# Patient Record
Sex: Female | Born: 1965 | Race: White | Hispanic: No | Marital: Married | Smoking: Former smoker
Health system: Southern US, Community
[De-identification: ages and names within clinical notes are randomized; demographics above are authoritative.]

## PROBLEM LIST (undated history)

## (undated) DIAGNOSIS — E538 Deficiency of other specified B group vitamins: Secondary | ICD-10-CM

## (undated) DIAGNOSIS — L900 Lichen sclerosus et atrophicus: Secondary | ICD-10-CM

## (undated) DIAGNOSIS — T7840XA Allergy, unspecified, initial encounter: Secondary | ICD-10-CM

## (undated) DIAGNOSIS — D563 Thalassemia minor: Secondary | ICD-10-CM

## (undated) DIAGNOSIS — K219 Gastro-esophageal reflux disease without esophagitis: Secondary | ICD-10-CM

## (undated) DIAGNOSIS — D649 Anemia, unspecified: Secondary | ICD-10-CM

## (undated) HISTORY — PX: WISDOM TOOTH EXTRACTION: SHX21

## (undated) HISTORY — DX: Lichen sclerosus et atrophicus: L90.0

## (undated) HISTORY — DX: Thalassemia minor: D56.3

## (undated) HISTORY — PX: BREAST SURGERY: SHX581

## (undated) HISTORY — DX: Anemia, unspecified: D64.9

## (undated) HISTORY — DX: Gastro-esophageal reflux disease without esophagitis: K21.9

## (undated) HISTORY — DX: Deficiency of other specified B group vitamins: E53.8

## (undated) HISTORY — DX: Allergy, unspecified, initial encounter: T78.40XA

---

## 2000-10-18 ENCOUNTER — Other Ambulatory Visit: Admission: RE | Admit: 2000-10-18 | Discharge: 2000-10-18 | Payer: Self-pay | Admitting: Obstetrics and Gynecology

## 2001-11-03 ENCOUNTER — Other Ambulatory Visit: Admission: RE | Admit: 2001-11-03 | Discharge: 2001-11-03 | Payer: Self-pay | Admitting: Obstetrics and Gynecology

## 2002-11-06 ENCOUNTER — Other Ambulatory Visit: Admission: RE | Admit: 2002-11-06 | Discharge: 2002-11-06 | Payer: Self-pay | Admitting: Obstetrics and Gynecology

## 2003-11-11 ENCOUNTER — Other Ambulatory Visit: Admission: RE | Admit: 2003-11-11 | Discharge: 2003-11-11 | Payer: Self-pay | Admitting: Obstetrics and Gynecology

## 2004-11-11 ENCOUNTER — Other Ambulatory Visit: Admission: RE | Admit: 2004-11-11 | Discharge: 2004-11-11 | Payer: Self-pay | Admitting: Obstetrics and Gynecology

## 2008-02-23 ENCOUNTER — Encounter: Admission: RE | Admit: 2008-02-23 | Discharge: 2008-02-23 | Payer: Self-pay | Admitting: Obstetrics and Gynecology

## 2009-02-24 ENCOUNTER — Encounter: Admission: RE | Admit: 2009-02-24 | Discharge: 2009-02-24 | Payer: Self-pay | Admitting: Obstetrics and Gynecology

## 2010-02-25 ENCOUNTER — Encounter: Admission: RE | Admit: 2010-02-25 | Discharge: 2010-02-25 | Payer: Self-pay | Admitting: Obstetrics and Gynecology

## 2011-01-21 ENCOUNTER — Other Ambulatory Visit: Payer: Self-pay | Admitting: Obstetrics and Gynecology

## 2011-01-21 DIAGNOSIS — Z1231 Encounter for screening mammogram for malignant neoplasm of breast: Secondary | ICD-10-CM

## 2011-03-01 ENCOUNTER — Ambulatory Visit
Admission: RE | Admit: 2011-03-01 | Discharge: 2011-03-01 | Disposition: A | Discharge status: Home / Self Care | Payer: BC Managed Care – PPO | Source: Ambulatory Visit | Attending: Obstetrics and Gynecology | Admitting: Obstetrics and Gynecology

## 2011-03-01 DIAGNOSIS — Z1231 Encounter for screening mammogram for malignant neoplasm of breast: Secondary | ICD-10-CM

## 2012-01-25 ENCOUNTER — Other Ambulatory Visit: Payer: Self-pay | Admitting: Obstetrics and Gynecology

## 2012-01-25 DIAGNOSIS — Z1231 Encounter for screening mammogram for malignant neoplasm of breast: Secondary | ICD-10-CM

## 2012-03-02 ENCOUNTER — Ambulatory Visit
Admission: RE | Admit: 2012-03-02 | Discharge: 2012-03-02 | Disposition: A | Discharge status: Home / Self Care | Payer: BC Managed Care – PPO | Source: Ambulatory Visit | Attending: Obstetrics and Gynecology | Admitting: Obstetrics and Gynecology

## 2012-03-02 DIAGNOSIS — Z1231 Encounter for screening mammogram for malignant neoplasm of breast: Secondary | ICD-10-CM

## 2013-01-09 ENCOUNTER — Ambulatory Visit (INDEPENDENT_AMBULATORY_CARE_PROVIDER_SITE_OTHER): Payer: BC Managed Care – PPO | Admitting: Adult Health

## 2013-01-09 ENCOUNTER — Encounter: Payer: Self-pay | Admitting: Adult Health

## 2013-01-09 VITALS — BP 132/80 | HR 80 | Temp 98.2°F | Resp 14 | Ht 64.0 in | Wt 156.0 lb

## 2013-01-09 DIAGNOSIS — Z23 Encounter for immunization: Secondary | ICD-10-CM

## 2013-01-09 DIAGNOSIS — Z1239 Encounter for other screening for malignant neoplasm of breast: Secondary | ICD-10-CM

## 2013-01-09 DIAGNOSIS — E538 Deficiency of other specified B group vitamins: Secondary | ICD-10-CM

## 2013-01-09 DIAGNOSIS — Z0000 Encounter for general adult medical examination without abnormal findings: Secondary | ICD-10-CM

## 2013-01-09 DIAGNOSIS — N951 Menopausal and female climacteric states: Secondary | ICD-10-CM

## 2013-01-09 LAB — VITAMIN B12: Vitamin B-12: 333 pg/mL (ref 211–911)

## 2013-01-09 LAB — COMPREHENSIVE METABOLIC PANEL
ALT: 13 U/L (ref 0–35)
AST: 19 U/L (ref 0–37)
Calcium: 9 mg/dL (ref 8.4–10.5)
Creatinine, Ser: 0.8 mg/dL (ref 0.4–1.2)
GFR: 82.28 mL/min (ref >60.00)
Glucose, Bld: 88 mg/dL (ref 70–99)
Potassium: 3.9 mEq/L (ref 3.5–5.1)
Sodium: 138 mEq/L (ref 135–145)
Total Protein: 7.5 g/dL (ref 6.0–8.3)

## 2013-01-09 LAB — CBC WITH DIFFERENTIAL/PLATELET
Basophils Absolute: 0 10*3/uL (ref 0.0–0.1)
HCT: 35.5 % — ABNORMAL LOW (ref 36.0–46.0)
Lymphocytes Relative: 24 % (ref 12.0–46.0)
MCHC: 31.4 g/dL (ref 30.0–36.0)
Monocytes Relative: 6.5 % (ref 3.0–12.0)
Platelets: 306 10*3/uL (ref 150.0–400.0)
WBC: 9.2 10*3/uL (ref 4.5–10.5)

## 2013-01-09 LAB — LDL CHOLESTEROL, DIRECT: Direct LDL: 123.5 mg/dL

## 2013-01-09 LAB — FOLLICLE STIMULATING HORMONE: FSH: 0.3 m[IU]/mL

## 2013-01-09 MED ORDER — CYANOCOBALAMIN 1000 MCG/ML IJ SOLN: 1000.0000 ug | Freq: Once | INTRAMUSCULAR | Status: DC

## 2013-01-09 MED ORDER — CYANOCOBALAMIN 1000 MCG/ML IJ SOLN
1000.0000 ug | Freq: Once | INTRAMUSCULAR | Status: AC
  Administered 2013-01-09: 1000 ug via INTRAMUSCULAR

## 2013-01-09 NOTE — Assessment & Plan Note (Signed)
Patient takes active BC pill continuously. She was started on this regimen secondary to migraine headaches that were considered hormonal. She is doing well on this regimen. However, patient is wondering how she would know when she no longer has a period since she is on this pill. Will check FSH level.

## 2013-01-09 NOTE — Progress Notes (Signed)
  Subjective:    Patient ID: Wendy Bautista, female    DOB: [birth year], 47 y.o.   MRN: [id number]  HPI  Wendy Bautista is a pleasant 47 y/o female who presents to the office today to establish care. She has a hx of GERD, Beta Thalassemia, B12 def., seasonal allergies. She was previously followed in [place], [place] by Cliffton Asters, NP. It was becoming increasingly difficult to drive out to [facility] for health care. She is followed by Dr Lavina Hamman for her GYN needs. Previous PAP was done 01/2012. Last Mammo was 03/02/12. We will schedule for mammo at [facility] off of [address]  Patient lives at home with her husband. They do not have any children. Wendy Bautista was adopted and does not have any information about her biological family regarding health hx. 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 She does not smoke. She has a glass of wine approximately weekly.   Current Outpatient Prescriptions on File Prior to Visit  Medication Sig Dispense Refill  . cetirizine (ZYRTEC) 10 MG tablet Take 10 mg by mouth daily.      Marland Kitchen desogestrel-ethinyl estradiol (APRI,EMOQUETTE,SOLIA) 0.15-30 MG-MCG tablet Take 1 tablet by mouth daily.      Marland Kitchen omeprazole (PRILOSEC) 20 MG capsule Take 20 mg by mouth daily.         Review of Systems  Constitutional: Negative.   HENT: Negative.        Occasional fluid in ear.  Eyes: Negative.   Respiratory: Negative.   Cardiovascular: Negative.   Gastrointestinal: Negative.   Genitourinary: Negative.   Musculoskeletal: Negative.   Neurological: Negative.   Hematological: Negative.   Psychiatric/Behavioral: Negative.     BP 132/80  Pulse 80  Temp 98.2 F (36.8 C) (Oral)  Resp 14  Ht 5\' 4"  (1.626 m)  Wt 156 lb (70.761 kg)  BMI 26.78 kg/m2  SpO2 100%     Objective:   Physical Exam  Constitutional: She is oriented to person, place, and time. She appears well-developed and well-nourished. No distress.  HENT:  Head: Normocephalic  and atraumatic.  Right Ear: External ear normal.  Left Ear: External ear normal.  Nose: Nose normal.  Mouth/Throat: Oropharynx is clear and moist.  Eyes: Conjunctivae normal and EOM are normal. Pupils are equal, round, and reactive to light.  Neck: Normal range of motion. Neck supple. No tracheal deviation present. No thyromegaly present.  Cardiovascular: Normal rate, regular rhythm and normal heart sounds.  Exam reveals no gallop and no friction rub.   No murmur heard. Pulmonary/Chest: Effort normal and breath sounds normal.  Abdominal: Soft. Bowel sounds are normal.  Musculoskeletal: Normal range of motion.  Lymphadenopathy:    She has no cervical adenopathy.  Neurological: She is alert and oriented to person, place, and time.  Skin: Skin is warm and dry.  Psychiatric: She has a normal mood and affect. Her behavior is normal. Judgment and thought content normal.       Assessment & Plan:

## 2013-01-09 NOTE — Assessment & Plan Note (Signed)
Normal physical exam. Will order yearly mammogram to be done at [facility] imaging per her request. Will check the following labs: cbc w/diff, cmet, lipids, B12, Vit D. Patient does not recall when her last tetanus shot was. Will order Tdap to be administered today.

## 2013-01-09 NOTE — Patient Instructions (Addendum)
  Thank you for choosing [facility] for your health care needs.  Please have your labs drawn prior to leaving the office.  Your lab results will be available through MyChart for your convenience. Please activate your MyChart at your earliest convenience. Instructions have been provided at the end of this form.  Return in 1 month for your B12 injection.  I will order a Mammography for you at [facility] on [address] to be done in March.  Please call if you have any questions or concerns.

## 2013-01-09 NOTE — Assessment & Plan Note (Signed)
Hx of B12 deficiency receiving month B12 injections. She has not had one since June. Will give her one today. Check levels.

## 2013-01-10 LAB — VITAMIN D 25 HYDROXY (VIT D DEFICIENCY, FRACTURES): Vit D, 25-Hydroxy: 55 ng/mL (ref 30–89)

## 2013-01-17 ENCOUNTER — Encounter: Payer: Self-pay | Admitting: Adult Health

## 2013-01-27 ENCOUNTER — Other Ambulatory Visit: Payer: Self-pay

## 2013-01-31 ENCOUNTER — Telehealth: Payer: Self-pay | Admitting: *Deleted

## 2013-01-31 NOTE — Telephone Encounter (Signed)
Patient called stating that she received a vial of B-12 from the pharmacy with no refills and not sure what to do with it.. Patient states that when she has gotten B-12 injections in the past she gets them at the doctor's office. Please let patient know what she should do?  Patient states that she has a follow-up appointment scheduled in March.

## 2013-02-01 NOTE — Telephone Encounter (Signed)
Informed patient to bring B12 she received from pharmacy to next visit

## 2013-02-01 NOTE — Telephone Encounter (Signed)
[name],  Let patient know to bring vitamin B12 vial with her for her next B12 injection and we will administer it for her.  Dason Mosley

## 2013-02-13 ENCOUNTER — Ambulatory Visit: Payer: BC Managed Care – PPO

## 2013-02-22 ENCOUNTER — Ambulatory Visit: Payer: BC Managed Care – PPO

## 2013-02-22 ENCOUNTER — Ambulatory Visit (INDEPENDENT_AMBULATORY_CARE_PROVIDER_SITE_OTHER): Payer: BC Managed Care – PPO | Admitting: *Deleted

## 2013-02-22 DIAGNOSIS — E538 Deficiency of other specified B group vitamins: Secondary | ICD-10-CM

## 2013-02-22 MED ORDER — CYANOCOBALAMIN 1000 MCG/ML IJ SOLN
1000.0000 ug | Freq: Once | INTRAMUSCULAR | Status: AC
  Administered 2013-02-22: 1000 ug via INTRAMUSCULAR

## 2013-03-07 ENCOUNTER — Other Ambulatory Visit: Payer: Self-pay | Admitting: *Deleted

## 2013-03-07 DIAGNOSIS — E538 Deficiency of other specified B group vitamins: Secondary | ICD-10-CM

## 2013-03-09 MED ORDER — CYANOCOBALAMIN 1000 MCG/ML IJ SOLN: 1000.0000 ug | Freq: Once | INTRAMUSCULAR | Status: DC

## 2013-04-11 ENCOUNTER — Ambulatory Visit (INDEPENDENT_AMBULATORY_CARE_PROVIDER_SITE_OTHER): Payer: BC Managed Care – PPO | Admitting: *Deleted

## 2013-04-11 DIAGNOSIS — E538 Deficiency of other specified B group vitamins: Secondary | ICD-10-CM

## 2013-04-11 MED ORDER — CYANOCOBALAMIN 1000 MCG/ML IJ SOLN
1000.0000 ug | Freq: Once | INTRAMUSCULAR | Status: AC
  Administered 2013-04-11: 1000 ug via INTRAMUSCULAR

## 2013-05-17 ENCOUNTER — Ambulatory Visit (INDEPENDENT_AMBULATORY_CARE_PROVIDER_SITE_OTHER): Payer: BC Managed Care – PPO | Admitting: *Deleted

## 2013-05-17 DIAGNOSIS — E538 Deficiency of other specified B group vitamins: Secondary | ICD-10-CM

## 2013-05-17 MED ORDER — CYANOCOBALAMIN 1000 MCG/ML IJ SOLN
1000.0000 ug | Freq: Once | INTRAMUSCULAR | Status: AC
  Administered 2013-05-17: 1000 ug via INTRAMUSCULAR

## 2013-07-17 ENCOUNTER — Encounter: Payer: Self-pay | Admitting: Adult Health

## 2013-07-17 ENCOUNTER — Ambulatory Visit (INDEPENDENT_AMBULATORY_CARE_PROVIDER_SITE_OTHER): Payer: BC Managed Care – PPO | Admitting: Adult Health

## 2013-07-17 VITALS — BP 106/66 | HR 69 | Temp 98.4°F | Resp 12 | Wt 152.0 lb

## 2013-07-17 DIAGNOSIS — J329 Chronic sinusitis, unspecified: Secondary | ICD-10-CM

## 2013-07-17 MED ORDER — AMOXICILLIN-POT CLAVULANATE 875-125 MG PO TABS: 1.0000 | ORAL_TABLET | Freq: Two times a day (BID) | ORAL | Status: DC

## 2013-07-17 MED ORDER — MOMETASONE FUROATE 50 MCG/ACT NA SUSP: 2.0000 | Freq: Every day | NASAL | Status: DC

## 2013-07-17 NOTE — Assessment & Plan Note (Signed)
Ongoing symptoms greater than 10 days. Also with left ear pressure. Fluid in ear. Start Augmentin bid x 10 days. Nasonex 2 sprays in each nostril. Irrigate sinuses at least 2 times per week. Call if not improved within 3-4 days.

## 2013-07-17 NOTE — Patient Instructions (Addendum)
  Start your antibiotic tonight. You will take 1 tablet twice a day for 10 days.  Use Nasonex 2 sprays into each nostril daily. If this helps let me know and I will send in prescription.  Irrigate sinuses at least twice a week.  Call if your symptoms are not improved within 3-4 days.

## 2013-07-17 NOTE — Progress Notes (Signed)
  Subjective:    Patient ID: Wendy Bautista, female    DOB: 1966-10-11, 46 y.o.   MRN: [id number]  HPI  Congestion, sinus pressure x 2 weeks. She has used Afrin today. She is experiencing left ear pain which started approximately 3 days ago. No fever or chills. She has also used a sinus and congestion medication which normally gives her a feeling of hang over the next day.   Current Outpatient Prescriptions on File Prior to Visit  Medication Sig Dispense Refill  . cetirizine (ZYRTEC) 10 MG tablet Take 10 mg by mouth daily.      . cyanocobalamin (,VITAMIN B-12,) 1000 MCG/ML injection Inject 1 mL (1,000 mcg total) into the muscle once.  1 mL  5  . desogestrel-ethinyl estradiol (APRI,EMOQUETTE,SOLIA) 0.15-30 MG-MCG tablet Take 1 tablet by mouth daily.      Marland Kitchen omeprazole (PRILOSEC) 20 MG capsule Take 20 mg by mouth daily.       No current facility-administered medications on file prior to visit.      Review of Systems  Constitutional: Negative for fever and chills.  HENT: Positive for ear pain, congestion, sore throat, postnasal drip and sinus pressure.        Ear pressure on left side  Respiratory: Positive for cough. Negative for shortness of breath and wheezing.   Cardiovascular: Negative.   Gastrointestinal: Positive for diarrhea. Negative for nausea and vomiting.       Objective:   Physical Exam  Constitutional: She is oriented to person, place, and time. She appears well-developed and well-nourished. No distress.  HENT:  Head: Normocephalic and atraumatic.  Right Ear: External ear normal.  Left TM bulging  Cardiovascular: Normal rate, regular rhythm, normal heart sounds and intact distal pulses.   Pulmonary/Chest: Effort normal and breath sounds normal. No respiratory distress. She has no wheezes. She has no rales.  Abdominal: Soft. Bowel sounds are normal.  Lymphadenopathy:    She has no cervical adenopathy.  Neurological: She is alert and oriented to person, place, and time.   Skin: Skin is warm and dry.  Psychiatric: She has a normal mood and affect. Her behavior is normal. Judgment and thought content normal.          Assessment & Plan:  cff

## 2013-08-02 ENCOUNTER — Encounter: Payer: Self-pay | Admitting: Adult Health

## 2013-08-02 NOTE — Telephone Encounter (Signed)
Sample left up front for pickup.

## 2013-08-18 ENCOUNTER — Encounter: Payer: Self-pay | Admitting: Adult Health

## 2013-08-20 ENCOUNTER — Telehealth: Payer: Self-pay | Admitting: *Deleted

## 2013-08-20 ENCOUNTER — Other Ambulatory Visit: Payer: Self-pay | Admitting: Adult Health

## 2013-08-20 MED ORDER — MOMETASONE FUROATE 50 MCG/ACT NA SUSP: 2.0000 | Freq: Every day | NASAL | Status: DC

## 2013-08-20 NOTE — Telephone Encounter (Signed)
Called [phone] for prior authorization on the Nasonex 50 mcg nasal spray, received PA request form, placed in [facility] boxes

## 2013-08-21 ENCOUNTER — Encounter: Payer: Self-pay | Admitting: Adult Health

## 2013-08-21 ENCOUNTER — Encounter: Payer: Self-pay | Admitting: *Deleted

## 2013-08-23 ENCOUNTER — Telehealth: Payer: Self-pay | Admitting: *Deleted

## 2013-08-23 NOTE — Telephone Encounter (Signed)
Faxed Prior Authorization for Nasonex to [employer].

## 2013-09-05 ENCOUNTER — Encounter: Payer: Self-pay | Admitting: Adult Health

## 2013-09-06 ENCOUNTER — Other Ambulatory Visit: Payer: Self-pay | Admitting: Adult Health

## 2013-09-06 MED ORDER — FLUTICASONE PROPIONATE 50 MCG/ACT NA SUSP: NASAL | Status: DC

## 2013-10-12 ENCOUNTER — Ambulatory Visit (INDEPENDENT_AMBULATORY_CARE_PROVIDER_SITE_OTHER): Payer: BC Managed Care – PPO

## 2013-10-12 DIAGNOSIS — Z23 Encounter for immunization: Secondary | ICD-10-CM

## 2014-02-11 ENCOUNTER — Encounter: Payer: Self-pay | Admitting: Adult Health

## 2014-02-14 ENCOUNTER — Other Ambulatory Visit: Payer: Self-pay | Admitting: Adult Health

## 2014-02-14 ENCOUNTER — Encounter: Payer: Self-pay | Admitting: Adult Health

## 2014-02-14 ENCOUNTER — Ambulatory Visit (INDEPENDENT_AMBULATORY_CARE_PROVIDER_SITE_OTHER)
Admission: RE | Admit: 2014-02-14 | Discharge: 2014-02-14 | Disposition: A | Discharge status: Home / Self Care | Payer: BC Managed Care – PPO | Source: Ambulatory Visit | Attending: Adult Health | Admitting: Adult Health

## 2014-02-14 ENCOUNTER — Ambulatory Visit (INDEPENDENT_AMBULATORY_CARE_PROVIDER_SITE_OTHER): Payer: BC Managed Care – PPO | Admitting: Adult Health

## 2014-02-14 VITALS — BP 110/74 | HR 80 | Temp 98.3°F | Resp 14 | Wt 162.0 lb

## 2014-02-14 DIAGNOSIS — R1032 Left lower quadrant pain: Secondary | ICD-10-CM | POA: Insufficient documentation

## 2014-02-14 DIAGNOSIS — R3129 Other microscopic hematuria: Secondary | ICD-10-CM

## 2014-02-14 DIAGNOSIS — R319 Hematuria, unspecified: Secondary | ICD-10-CM

## 2014-02-14 LAB — URINALYSIS, ROUTINE W REFLEX MICROSCOPIC
BILIRUBIN URINE: NEGATIVE
Ketones, ur: NEGATIVE
Leukocytes, UA: NEGATIVE
NITRITE: NEGATIVE
PH: 6 (ref 5.0–8.0)
SPECIFIC GRAVITY, URINE: 1.01 (ref 1.000–1.030)
TOTAL PROTEIN, URINE-UPE24: NEGATIVE
Urine Glucose: NEGATIVE
Urobilinogen, UA: 0.2 (ref 0.0–1.0)

## 2014-02-14 LAB — POCT URINALYSIS DIPSTICK
Bilirubin, UA: NEGATIVE
Glucose, UA: NEGATIVE
Ketones, UA: NEGATIVE
Leukocytes, UA: NEGATIVE
Nitrite, UA: NEGATIVE
PROTEIN UA: NEGATIVE
SPEC GRAV UA: 1.01
Urobilinogen, UA: 0.2
pH, UA: 5.5

## 2014-02-14 LAB — CBC WITH DIFFERENTIAL/PLATELET
BASOS PCT: 0.7 % (ref 0.0–3.0)
Basophils Absolute: 0.1 10*3/uL (ref 0.0–0.1)
EOS ABS: 0.1 10*3/uL (ref 0.0–0.7)
EOS PCT: 0.9 % (ref 0.0–5.0)
HEMATOCRIT: 35.7 % — AB (ref 36.0–46.0)
Hemoglobin: 11.2 g/dL — ABNORMAL LOW (ref 12.0–15.0)
LYMPHS PCT: 32.7 % (ref 12.0–46.0)
Lymphs Abs: 2.8 10*3/uL (ref 0.7–4.0)
MCHC: 31.5 g/dL (ref 30.0–36.0)
MONO ABS: 0.7 10*3/uL (ref 0.1–1.0)
MONOS PCT: 8.1 % (ref 3.0–12.0)
Neutro Abs: 4.9 10*3/uL (ref 1.4–7.7)
Neutrophils Relative %: 57.6 % (ref 43.0–77.0)
PLATELETS: 330 10*3/uL (ref 150.0–400.0)
RBC: 5.74 Mil/uL — ABNORMAL HIGH (ref 3.87–5.11)
RDW: 16.9 % — ABNORMAL HIGH (ref 11.5–14.6)
WBC: 8.6 10*3/uL (ref 4.5–10.5)

## 2014-02-14 LAB — BASIC METABOLIC PANEL
BUN: 9 mg/dL (ref 6–23)
CALCIUM: 9 mg/dL (ref 8.4–10.5)
CO2: 25 mEq/L (ref 19–32)
CREATININE: 0.7 mg/dL (ref 0.4–1.2)
Chloride: 104 mEq/L (ref 96–112)
GFR: 91.01 mL/min (ref >60.00)
Glucose, Bld: 75 mg/dL (ref 70–99)
Potassium: 4.1 mEq/L (ref 3.5–5.1)
SODIUM: 135 meq/L (ref 135–145)

## 2014-02-14 NOTE — Progress Notes (Signed)
Patient ID: Wendy Bautista, female   DOB: [birth year], 48 y.o.   MRN: [id number]    Subjective:    Patient ID: Wendy SongJenny Bautista, female    DOB: [birth year], 48 y.o.   MRN: [id number]  HPI  Wendy Bautista is a pleasant 48 y/o female who presents to clinic with left lower quadrant pain. She denies fever, chills. She has had periods of loose stools. Denies blood in her stool. She reports recent visit to GYN and was notified that she had blood in her urine. She reports that they did not give her further instructions or follow up.   Past Medical History  Diagnosis Date  . GERD (gastroesophageal reflux disease)   . Thalassemia trait, beta   . Lichen sclerosus   . B12 deficiency     Current Outpatient Prescriptions on File Prior to Visit  Medication Sig Dispense Refill  . cetirizine (ZYRTEC) 10 MG tablet Take 10 mg by mouth daily.      Marland Kitchen. desogestrel-ethinyl estradiol (APRI,EMOQUETTE,SOLIA) 0.15-30 MG-MCG tablet Take 1 tablet by mouth daily.      . fluticasone (FLONASE) 50 MCG/ACT nasal spray 2 sprays into each nostril once daily  16 g  6  . omeprazole (PRILOSEC) 20 MG capsule Take 20 mg by mouth daily as needed.        No current facility-administered medications on file prior to visit.   Review of Systems  Constitutional: Negative.  Negative for fever and chills.  HENT: Negative.   Eyes: Negative.   Respiratory: Negative.   Cardiovascular: Negative.   Gastrointestinal: Positive for abdominal pain (left side). Negative for blood in stool. Diarrhea: loose stools.  Endocrine: Negative.   Genitourinary: Negative.   Musculoskeletal: Negative.   Skin: Negative.   Allergic/Immunologic: Negative.   Neurological: Negative.   Hematological: Negative.   Psychiatric/Behavioral: Negative.        Objective:  BP 110/74  Pulse 80  Temp(Src) 98.3 F (36.8 C) (Oral)  Resp 14  Wt 162 lb (73.483 kg)  SpO2 97%   Physical Exam  Constitutional: She is oriented to person, place, and time. She appears  well-developed and well-nourished. No distress.  Cardiovascular: Normal rate and regular rhythm.   Pulmonary/Chest: Effort normal. No respiratory distress.  Abdominal: Soft. Bowel sounds are normal. She exhibits no distension and no mass. There is tenderness. There is no rebound and no guarding.  Neurological: She is alert and oriented to person, place, and time.  Skin: Skin is warm and dry.  Psychiatric: She has a normal mood and affect. Her behavior is normal. Judgment and thought content normal.       Assessment & Plan:   1. Abdominal pain, left lower quadrant Symptoms persist but have improved. ?Diverticulitis, nephrolithiasis. Check labs. Will have KUB. Pt may need CT scan if ongoing symptoms and no findings on KUB.  - CBC with Differential - Basic metabolic panel - DG Abd 1 View; Future  2. Blood in urine Check UA, urinalysis. Have KUB to r/o kidney stone; however, may need CT scan  - DG Abd 1 View; Future - Urinalysis, Routine w reflex microscopic - POCT urinalysis dipstick

## 2014-02-14 NOTE — Progress Notes (Signed)
Pre visit review using our clinic review tool, if applicable. No additional management support is needed unless otherwise documented below in the visit note. 

## 2014-04-12 ENCOUNTER — Ambulatory Visit: Payer: BC Managed Care – PPO | Admitting: Adult Health

## 2014-04-18 ENCOUNTER — Encounter: Payer: Self-pay | Admitting: Adult Health

## 2014-04-18 ENCOUNTER — Ambulatory Visit (INDEPENDENT_AMBULATORY_CARE_PROVIDER_SITE_OTHER): Payer: BC Managed Care – PPO | Admitting: Adult Health

## 2014-04-18 VITALS — BP 104/62 | HR 80 | Temp 98.2°F | Resp 12 | Wt 158.8 lb

## 2014-04-18 DIAGNOSIS — J3489 Other specified disorders of nose and nasal sinuses: Secondary | ICD-10-CM

## 2014-04-18 NOTE — Progress Notes (Signed)
Pre visit review using our clinic review tool, if applicable. No additional management support is needed unless otherwise documented below in the visit note. 

## 2014-04-18 NOTE — Progress Notes (Signed)
   Subjective:    Patient ID: Wendy Bautista, female    DOB: 1966-01-06, 48 y.o.   MRN: [id number]  HPI Wendy Bautista is a pleasant 48 y/o female who presents to clinic with ongoing symptoms of sinus pressure, post nasal drip, cough, and occasional ear pressure mainly on the left. She takes zyrtec or allegra but has not been consistent with this. She uses flonase. No fever, chills. She feels that her symptoms are always present. She would like to be tested for allergies to see if there is something in particular that may be causing her symptoms.  Past Medical History  Diagnosis Date  . GERD (gastroesophageal reflux disease)   . Thalassemia trait, beta   . Lichen sclerosus   . B12 deficiency     Current Outpatient Prescriptions on File Prior to Visit  Medication Sig Dispense Refill  . cetirizine (ZYRTEC) 10 MG tablet Take 10 mg by mouth daily.      Marland Kitchen. desogestrel-ethinyl estradiol (APRI,EMOQUETTE,SOLIA) 0.15-30 MG-MCG tablet Take 1 tablet by mouth daily.      . fluticasone (FLONASE) 50 MCG/ACT nasal spray 2 sprays into each nostril once daily  16 g  6  . omeprazole (PRILOSEC) 20 MG capsule Take 20 mg by mouth daily as needed.        No current facility-administered medications on file prior to visit.   Review of Systems  Constitutional: Negative for fever and chills.  HENT: Positive for postnasal drip and sinus pressure. Negative for sore throat.   Respiratory: Positive for cough.   All other systems reviewed and are negative.      Objective:   Physical Exam  Constitutional: She is oriented to person, place, and time. She appears well-developed and well-nourished. No distress.  HENT:  Head: Normocephalic and atraumatic.  Right Ear: External ear normal.  Left Ear: External ear normal.  Mouth/Throat: Oropharynx is clear and moist. No oropharyngeal exudate.  Eyes: Conjunctivae and EOM are normal.  Cardiovascular: Normal rate, regular rhythm and normal heart sounds.  Exam reveals no gallop.     No murmur heard. Pulmonary/Chest: Effort normal and breath sounds normal. No respiratory distress. She has no wheezes. She has no rales.  Lymphadenopathy:    She has no cervical adenopathy.  Neurological: She is alert and oriented to person, place, and time.  Psychiatric: She has a normal mood and affect. Her behavior is normal. Judgment and thought content normal.      Assessment & Plan:   1. Sinus pressure Advised to take the allegra or zyrtec regularly. Continue flonase daily. Irrigate sinuses daily. Try sudafed to see if it helps her symptoms. Delsym for cough. I am referring her to ENT for evaluation of her sinus pressure and ear symptoms as well as for possible allergy testing. - Ambulatory referral to ENT

## 2014-04-18 NOTE — Patient Instructions (Signed)
  I am referring you to ENT for evaluation of your ongoing sinus, ear symptoms and for allergy testing.  The office will contact you with an appointment.  Continue flonase nasal spray.  Irrigate your sinuses with saline at least once daily.  Take allegra or zyrtec daily for control on allergies.  You can try sudafed to see if this improves some of your symptoms.  Delsym with guaifenesin and dextromethorphan for cough.  Cool mist humidifier may also help alleviate your symptoms.

## 2014-09-05 ENCOUNTER — Encounter: Payer: Self-pay | Admitting: Internal Medicine

## 2014-09-05 ENCOUNTER — Ambulatory Visit (INDEPENDENT_AMBULATORY_CARE_PROVIDER_SITE_OTHER): Payer: BC Managed Care – PPO | Admitting: Internal Medicine

## 2014-09-05 VITALS — BP 110/66 | HR 82 | Temp 98.5°F | Wt 157.0 lb

## 2014-09-05 DIAGNOSIS — R1032 Left lower quadrant pain: Secondary | ICD-10-CM

## 2014-09-05 DIAGNOSIS — R197 Diarrhea, unspecified: Secondary | ICD-10-CM

## 2014-09-05 DIAGNOSIS — R109 Unspecified abdominal pain: Secondary | ICD-10-CM

## 2014-09-05 MED ORDER — PROMETHAZINE HCL 12.5 MG PO TABS: 12.5000 mg | ORAL_TABLET | Freq: Four times a day (QID) | ORAL | Status: DC | PRN

## 2014-09-05 NOTE — Progress Notes (Signed)
Pre visit review using our clinic review tool, if applicable. No additional management support is needed unless otherwise documented below in the visit note. 

## 2014-09-05 NOTE — Progress Notes (Signed)
Subjective:    Patient ID: Wendy Bautista, female    DOB: 29-Jun-1966, 48 y.o.   MRN: [id number]  HPI  Patient presents with diarrhea, nausea and cramping for the past 5 days--watery--loose--has noticed thick mucous--denied seeing blood in stool--color--pale--yellow....5-8 episodes each, nocturnal 2-4, have taken imodium with some relief yesterday. No recent abx - no sick contacts , no fever,   Review of Systems  Past Medical History  Diagnosis Date  . GERD (gastroesophageal reflux disease)   . Thalassemia trait, beta   . Lichen sclerosus   . B12 deficiency     Current Outpatient Prescriptions  Medication Sig Dispense Refill  . cetirizine (ZYRTEC) 10 MG tablet Take 10 mg by mouth daily.      Marland Kitchen desogestrel-ethinyl estradiol (APRI,EMOQUETTE,SOLIA) 0.15-30 MG-MCG tablet Take 1 tablet by mouth daily.      . fluticasone (FLONASE) 50 MCG/ACT nasal spray 2 sprays into each nostril once daily  16 g  6  . omeprazole (PRILOSEC) 20 MG capsule Take 20 mg by mouth daily as needed.        No current facility-administered medications for this visit.    Allergies  Allergen Reactions  . Clostebol     No family history on file.  History   Social History  . Marital Status: Married    Spouse Name: Albertia Carvin    Number of Children: 0  . Years of Education: 13   Occupational History  . Underwriter    Social History Main Topics  . Smoking status: Never Smoker   . Smokeless tobacco: Never Used  . Alcohol Use: 0.6 oz/week    1 Glasses of wine per week  . Drug Use: No  . Sexual Activity: Yes   Other Topics Concern  . Not on file   Social History Narrative  . No narrative on file     Constitutional: Denies fever, malaise, fatigue, headache or abrupt weight changes.  Respiratory: Denies difficulty breathing, shortness of breath, cough or sputum production.   Cardiovascular: Denies chest pain, chest tightness, palpitations or swelling in the hands or feet.  Gastrointestinal:  Denies abdominal pain, bloating, constipation, diarrhea or blood in the stool.    No other specific complaints in a complete review of systems (except as listed in HPI above).     Objective:   Physical Exam  Wt 157 lb (71.215 kg) Wt Readings from Last 3 Encounters:  09/05/14 157 lb (71.215 kg)  04/18/14 158 lb 12 oz (72.009 kg)  02/14/14 162 lb (73.483 kg)    General: Appears their stated age, well developed, well nourished in NAD. Cardiovascular: Normal rate and rhythm. S1,S2 noted.  No murmur, rubs or gallops noted. No JVD or BLE edema. No carotid bruits noted. Pulmonary/Chest: Normal effort and positive vesicular breath sounds. No respiratory distress. No wheezes, rales or ronchi noted.  Abdomen: Soft and nontender. Normal bowel sounds, no bruits noted. No distention or masses noted. Liver, spleen and kidneys non palpable.     Component Value Date/Time   NA 135 02/14/2014 1237   K 4.1 02/14/2014 1237   CL 104 02/14/2014 1237   CO2 25 02/14/2014 1237   GLUCOSE 75 02/14/2014 1237   BUN 9 02/14/2014 1237   CREATININE 0.7 02/14/2014 1237   CALCIUM 9.0 02/14/2014 1237    Lipid Panel     Component Value Date/Time   CHOL 203* 01/09/2013 1051   TRIG 182.0* 01/09/2013 1051   HDL 53.90 01/09/2013 1051   CHOLHDL 4 01/09/2013 1051  VLDL 36.4 01/09/2013 1051    CBC    Component Value Date/Time   WBC 8.6 02/14/2014 1237   RBC 5.74* 02/14/2014 1237   HGB 11.2* 02/14/2014 1237   HCT 35.7* 02/14/2014 1237   PLT 330.0 02/14/2014 1237   MCV 62.2 Repeated and verified X2.* 02/14/2014 1237   MCHC 31.5 02/14/2014 1237   RDW 16.9* 02/14/2014 1237   LYMPHSABS 2.8 02/14/2014 1237   MONOABS 0.7 02/14/2014 1237   EOSABS 0.1 02/14/2014 1237   BASOSABS 0.1 02/14/2014 1237    Hgb A1C No results found for this basename: HGBA1C         Assessment & Plan:

## 2014-09-05 NOTE — Progress Notes (Signed)
Subjective:    Patient ID: Wendy Bautista, female    DOB: May 08, 1966, 48 y.o.   MRN: [id number]  HPI  Pt presents to the clinic today with c/o abdominal cramping and diarrhea. She reports the cramping occurs shortly after eating. She does have associated nausea. She has been having loose stools for the last 5 days. The stool is pale yellow mixed with mucous. She is having 5-8 episodes during the day, nocturnal as well. She denies blood in her stool. She has tried Imodium with some relief. She denies changes in her diet. She is on prilosec for GERD. She denies recent antibiotic use.  Review of Systems      Past Medical History  Diagnosis Date  . GERD (gastroesophageal reflux disease)   . Thalassemia trait, beta   . Lichen sclerosus   . B12 deficiency     Current Outpatient Prescriptions  Medication Sig Dispense Refill  . cetirizine (ZYRTEC) 10 MG tablet Take 10 mg by mouth daily.      Marland Kitchen desogestrel-ethinyl estradiol (APRI,EMOQUETTE,SOLIA) 0.15-30 MG-MCG tablet Take 1 tablet by mouth daily.      . fluticasone (FLONASE) 50 MCG/ACT nasal spray 2 sprays into each nostril once daily  16 g  6  . omeprazole (PRILOSEC) 20 MG capsule Take 20 mg by mouth daily as needed.        No current facility-administered medications for this visit.    Allergies  Allergen Reactions  . Clostebol     No family history on file.  History   Social History  . Marital Status: Married    Spouse Name: Christi Wirick    Number of Children: 0  . Years of Education: 13   Occupational History  . Underwriter    Social History Main Topics  . Smoking status: Never Smoker   . Smokeless tobacco: Never Used  . Alcohol Use: 0.6 oz/week    1 Glasses of wine per week  . Drug Use: No  . Sexual Activity: Yes   Other Topics Concern  . Not on file   Social History Narrative  . No narrative on file     Constitutional: Denies fever, malaise, fatigue, headache or abrupt weight changes.  Gastrointestinal:  Pt reports abdominal cramping and diarrhea. Denies bloating, constipation, or blood in the stool.    No other specific complaints in a complete review of systems (except as listed in HPI above).  Objective:   Physical Exam   BP 110/66  Pulse 82  Temp(Src) 98.5 F (36.9 C) (Oral)  Wt 157 lb (71.215 kg)  SpO2 99% Wt Readings from Last 3 Encounters:  09/05/14 157 lb (71.215 kg)  04/18/14 158 lb 12 oz (72.009 kg)  02/14/14 162 lb (73.483 kg)    General: Appears her stated age, well developed, well nourished in NAD. Cardiovascular: Normal rate and rhythm. S1,S2 noted.  No murmur, rubs or gallops noted.  Pulmonary/Chest: Normal effort and positive vesicular breath sounds. No respiratory distress. No wheezes, rales or ronchi noted.  Abdomen: Soft and nontender. Normal bowel sounds, no bruits noted. No distention or masses noted. Liver, spleen and kidneys non palpable.   BMET    Component Value Date/Time   NA 135 02/14/2014 1237   K 4.1 02/14/2014 1237   CL 104 02/14/2014 1237   CO2 25 02/14/2014 1237   GLUCOSE 75 02/14/2014 1237   BUN 9 02/14/2014 1237   CREATININE 0.7 02/14/2014 1237   CALCIUM 9.0 02/14/2014 1237    Lipid Panel  Component Value Date/Time   CHOL 203* 01/09/2013 1051   TRIG 182.0* 01/09/2013 1051   HDL 53.90 01/09/2013 1051   CHOLHDL 4 01/09/2013 1051   VLDL 36.4 01/09/2013 1051    CBC    Component Value Date/Time   WBC 8.6 02/14/2014 1237   RBC 5.74* 02/14/2014 1237   HGB 11.2* 02/14/2014 1237   HCT 35.7* 02/14/2014 1237   PLT 330.0 02/14/2014 1237   MCV 62.2 Repeated and verified X2.* 02/14/2014 1237   MCHC 31.5 02/14/2014 1237   RDW 16.9* 02/14/2014 1237   LYMPHSABS 2.8 02/14/2014 1237   MONOABS 0.7 02/14/2014 1237   EOSABS 0.1 02/14/2014 1237   BASOSABS 0.1 02/14/2014 1237    Hgb A1C No results found for this basename: HGBA1C        Assessment & Plan:   Abdominal cramping and diarrhea:  ? Cause Will check stool culture, ova and parasite and Cdiff Drink plenty of  fluids Take Imodium OTC  Will get back with you after the labs are back

## 2014-09-05 NOTE — Patient Instructions (Signed)

## 2014-09-06 ENCOUNTER — Other Ambulatory Visit: Payer: Self-pay | Admitting: Internal Medicine

## 2014-09-06 NOTE — Addendum Note (Signed)
Addended by: Roena Malady on: 09/06/2014 08:51 AM   Modules accepted: Orders

## 2014-09-06 NOTE — Addendum Note (Signed)
Addended by: Baldomero Lamy on: 09/06/2014 10:51 AM   Modules accepted: Orders

## 2014-09-07 LAB — C. DIFFICILE GDH AND TOXIN A/B
C. DIFFICILE GDH: NOT DETECTED
C. difficile Toxin A/B: NOT DETECTED

## 2014-09-09 LAB — GIARDIA/CRYPTOSPORIDIUM (EIA)
CRYPTOSPORIDIUM SCREEN (EIA) (SOL): NEGATIVE
Giardia Screen (EIA): NEGATIVE

## 2014-09-10 LAB — STOOL CULTURE

## 2014-09-12 ENCOUNTER — Encounter: Payer: Self-pay | Admitting: Internal Medicine

## 2014-09-13 ENCOUNTER — Encounter: Payer: Self-pay | Admitting: Internal Medicine

## 2014-09-13 ENCOUNTER — Ambulatory Visit (INDEPENDENT_AMBULATORY_CARE_PROVIDER_SITE_OTHER): Payer: BC Managed Care – PPO | Admitting: Internal Medicine

## 2014-09-13 VITALS — BP 120/88 | HR 89 | Temp 98.1°F | Ht 64.0 in | Wt 158.5 lb

## 2014-09-13 DIAGNOSIS — R197 Diarrhea, unspecified: Secondary | ICD-10-CM

## 2014-09-13 DIAGNOSIS — D563 Thalassemia minor: Secondary | ICD-10-CM | POA: Insufficient documentation

## 2014-09-13 DIAGNOSIS — R110 Nausea: Secondary | ICD-10-CM

## 2014-09-13 LAB — LIPASE: Lipase: 16 U/L (ref 0–75)

## 2014-09-13 MED ORDER — ONDANSETRON 8 MG PO TBDP: 8.0000 mg | ORAL_TABLET | Freq: Three times a day (TID) | ORAL | Status: DC | PRN

## 2014-09-13 NOTE — Assessment & Plan Note (Addendum)
Recent watery diarrhea. Labs including stool studies were negative. Will get CT abdomen given recent left epigastric pain. Will also check celiac panel and IBD panel. Given some epigastric pain and nausea will check lipase. Will set up GI evaluation for possible colonoscopy. Question microscopic colitis.

## 2014-09-13 NOTE — Progress Notes (Signed)
Pre visit review using our clinic review tool, if applicable. No additional management support is needed unless otherwise documented below in the visit note. 

## 2014-09-13 NOTE — Assessment & Plan Note (Signed)
Will change to Ondansetron. Will check lipase with labs. CT abdomen for further evaluation given epigastric and lower abdominal pain.

## 2014-09-13 NOTE — Assessment & Plan Note (Signed)
Reviewed labs with pt. Mild anemia noted and previous evaluation c/w beta thalassemia minor.

## 2014-09-13 NOTE — Progress Notes (Signed)
Subjective:    Patient ID: Wendy Bautista, female    DOB: Apr 23, 1966, 48 y.o.   MRN: [id number]  HPI 48YO female presents to follow up diarrhea. Evaluated by another provider 9/24 and had labs and stool studies which were normal. Continues to have watery diarrhea. 4 episodes this morning. Wakes out of sleep. Some discomfort in left upper abdomen.  No blood in stool. No vomiting, but feels nauseous. Feels tired. No fever. Certain foods, particularly dairy makes symptoms worse. Cereal and oatmeal. Not taking anything for symptoms. Was prescribed phenergan, but cannot tolerate because of drowsiness. No history of colonoscopy. Adopted, but knows that mother died from colon cancer.  Review of Systems  Constitutional: Positive for fatigue. Negative for fever, chills, appetite change and unexpected weight change.  Eyes: Negative for visual disturbance.  Respiratory: Negative for shortness of breath.   Cardiovascular: Negative for chest pain and leg swelling.  Gastrointestinal: Positive for nausea and diarrhea. Negative for vomiting, abdominal pain, constipation, blood in stool, anal bleeding and rectal pain.  Skin: Negative for color change and rash.  Hematological: Negative for adenopathy. Does not bruise/bleed easily.  Psychiatric/Behavioral: Negative for dysphoric mood. The patient is not nervous/anxious.        Objective:    BP 120/88  Pulse 89  Temp(Src) 98.1 F (36.7 C) (Oral)  Ht 5\' 4"  (1.626 m)  Wt 158 lb 8 oz (71.895 kg)  BMI 27.19 kg/m2  SpO2 98% Physical Exam  Constitutional: She is oriented to person, place, and time. She appears well-developed and well-nourished. No distress.  HENT:  Head: Normocephalic and atraumatic.  Right Ear: External ear normal.  Left Ear: External ear normal.  Nose: Nose normal.  Mouth/Throat: Oropharynx is clear and moist. No oropharyngeal exudate.  Eyes: Conjunctivae and EOM are normal. Pupils are equal, round, and reactive to light. Right eye  exhibits no discharge. Left eye exhibits no discharge. No scleral icterus.  Neck: Normal range of motion. Neck supple. No tracheal deviation present. No thyromegaly present.  Cardiovascular: Normal rate, regular rhythm, normal heart sounds and intact distal pulses.  Exam reveals no gallop and no friction rub.   No murmur heard. Pulmonary/Chest: Effort normal and breath sounds normal. No accessory muscle usage. Not tachypneic. No respiratory distress. She has no decreased breath sounds. She has no wheezes. She has no rhonchi. She has no rales. She exhibits no tenderness.  Abdominal: Soft. Bowel sounds are normal. She exhibits no distension and no mass. There is tenderness (left epigastric area). There is no rebound and no guarding.  Musculoskeletal: Normal range of motion. She exhibits no edema and no tenderness.  Lymphadenopathy:    She has no cervical adenopathy.  Neurological: She is alert and oriented to person, place, and time. No cranial nerve deficit. She exhibits normal muscle tone. Coordination normal.  Skin: Skin is warm and dry. No rash noted. She is not diaphoretic. No erythema. No pallor.  Psychiatric: She has a normal mood and affect. Her behavior is normal. Judgment and thought content normal.          Assessment & Plan:   Problem List Items Addressed This Visit     Unprioritized   Beta thalassemia minor     Reviewed labs with pt. Mild anemia noted and previous evaluation c/w beta thalassemia minor.    Relevant Orders      Lipase   Diarrhea - Primary     Recent watery diarrhea. Labs including stool studies were negative. Will get CT abdomen  given recent left epigastric pain. Will also check celiac panel and IBD panel. Given some epigastric pain and nausea will check lipase. Will set up GI evaluation for possible colonoscopy. Question microscopic colitis.    Relevant Orders      CT Abdomen Pelvis W Contrast      Gliadin antibodies, serum      Tissue transglutaminase,  IgA      Reticulin Antibody, IgA w reflex titer      Inflammatory bowel disease panel      Ambulatory referral to Gastroenterology      Lipase   Nausea without vomiting     Will change to Ondansetron. Will check lipase with labs. CT abdomen for further evaluation given epigastric and lower abdominal pain.    Relevant Medications      ondansetron (ZOFRAN-ODT) disintegrating tablet   Other Relevant Orders      Lipase       Return in about 4 weeks (around 10/11/2014) for Recheck.

## 2014-09-13 NOTE — Patient Instructions (Addendum)
Start FODMAP diet.  Labs today.  CT abdomen and pelvis.  We will set up an evaluation with GI.  Follow up in 4 weeks.

## 2014-09-17 ENCOUNTER — Ambulatory Visit: Payer: Self-pay | Admitting: Internal Medicine

## 2014-09-18 ENCOUNTER — Telehealth: Payer: Self-pay | Admitting: Internal Medicine

## 2014-09-18 DIAGNOSIS — I700 Atherosclerosis of aorta: Secondary | ICD-10-CM | POA: Insufficient documentation

## 2014-09-18 NOTE — Telephone Encounter (Signed)
CT abdomen showed no findings to explain diarrhea. Message sent to patient.

## 2014-09-19 ENCOUNTER — Encounter: Payer: Self-pay | Admitting: Nurse Practitioner

## 2014-09-19 ENCOUNTER — Encounter: Payer: Self-pay | Admitting: Internal Medicine

## 2014-09-21 ENCOUNTER — Encounter: Payer: Self-pay | Admitting: Internal Medicine

## 2014-09-27 ENCOUNTER — Other Ambulatory Visit: Payer: Self-pay

## 2014-10-07 ENCOUNTER — Telehealth: Payer: Self-pay | Admitting: *Deleted

## 2014-10-07 ENCOUNTER — Encounter: Payer: Self-pay | Admitting: Nurse Practitioner

## 2014-10-07 ENCOUNTER — Ambulatory Visit (INDEPENDENT_AMBULATORY_CARE_PROVIDER_SITE_OTHER): Payer: BC Managed Care – PPO | Admitting: Physician Assistant

## 2014-10-07 VITALS — BP 108/62 | HR 76 | Ht 64.0 in | Wt 156.0 lb

## 2014-10-07 DIAGNOSIS — R197 Diarrhea, unspecified: Secondary | ICD-10-CM

## 2014-10-07 DIAGNOSIS — K219 Gastro-esophageal reflux disease without esophagitis: Secondary | ICD-10-CM

## 2014-10-07 DIAGNOSIS — R194 Change in bowel habit: Secondary | ICD-10-CM

## 2014-10-07 MED ORDER — DICYCLOMINE HCL 10 MG PO CAPS: ORAL_CAPSULE | ORAL | Status: DC

## 2014-10-07 MED ORDER — PANTOPRAZOLE SODIUM 40 MG PO TBEC: DELAYED_RELEASE_TABLET | ORAL | Status: DC

## 2014-10-07 MED ORDER — BENEFIBER PO POWD: ORAL | Status: DC

## 2014-10-07 MED ORDER — PEG-KCL-NACL-NASULF-NA ASC-C 100 G PO SOLR: 1.0000 | Freq: Once | ORAL | Status: DC

## 2014-10-07 NOTE — Progress Notes (Signed)
Agree with initial assessment and plans 

## 2014-10-07 NOTE — Telephone Encounter (Signed)
I called and advised her that we got a fax back from [facility].  They had no lab results for this patient.  Per Lawson FiscalLori Hvozdovic PA-C, we are putting orders in for labs, Celiac panel, CBC Diff, CMET. I left a message for her to please call me and let me know if she wants to come back here for labs or [facility]. I can fax the orders to them.

## 2014-10-07 NOTE — Patient Instructions (Signed)
You have been scheduled for a colonoscopy/endoscopy. Please follow written instructions given to you at your visit today.  Please pick up your prep kit at the pharmacy within the next 1-3 days. If you use inhalers (even only as needed), please bring them with you on the day of your procedure. Your physician has requested that you go to www.startemmi.com and enter the access code given to you at your visit today. This web site gives a general overview about your procedure. However, you should still follow specific instructions given to you by our office regarding your preparation for the procedure.  We have sent the following medications to your pharmacy for you to pick up at your convenience: Moviprep pantoprazole Dicyclomine  Discontinue Omeprazole  Please purchase Benefiber OTC and take 1 tablespoon by mouth daily in liquid.  Call our office back in 2 weeks with an update.  Food Choices for Gastroesophageal Reflux Disease When you have gastroesophageal reflux disease (GERD), the foods you eat and your eating habits are very important. Choosing the right foods can help ease the discomfort of GERD. WHAT GENERAL GUIDELINES DO I NEED TO FOLLOW?  Choose fruits, vegetables, whole grains, low-fat dairy products, and low-fat meat, fish, and poultry.  Limit fats such as oils, salad dressings, butter, nuts, and avocado.  Keep a food diary to identify foods that cause symptoms.  Avoid foods that cause reflux. These may be different for different people.  Eat frequent small meals instead of three large meals each day.  Eat your meals slowly, in a relaxed setting.  Limit fried foods.  Cook foods using methods other than frying.  Avoid drinking alcohol.  Avoid drinking large amounts of liquids with your meals.  Avoid bending over or lying down until 2-3 hours after eating. WHAT FOODS ARE NOT RECOMMENDED? The following are some foods and drinks that may worsen your  symptoms: Vegetables Tomatoes. Tomato juice. Tomato and spaghetti sauce. Chili peppers. Onion and garlic. Horseradish. Fruits Oranges, grapefruit, and lemon (fruit and juice). Meats High-fat meats, fish, and poultry. This includes hot dogs, ribs, ham, sausage, salami, and bacon. Dairy Whole milk and chocolate milk. Sour cream. Cream. Butter. Ice cream. Cream cheese.  Beverages Coffee and tea, with or without caffeine. Carbonated beverages or energy drinks. Condiments Hot sauce. Barbecue sauce.  Sweets/Desserts Chocolate and cocoa. Donuts. Peppermint and spearmint. Fats and Oils High-fat foods, including French fries and potato chips. Other Vinegar. Strong spices, such as black pepper, white pepper, red pepper, cayenne, curry powder, cloves, ginger, and chili powder. The items listed above may not be a complete list of foods and beverages to avoid. Contact your dietitian for more information. Document Released: 11/29/2005 Document Revised: 12/04/2013 Document Reviewed: 10/03/2013 Exit[employer] Patient Information 2015 [employer]. This information is not intended to replace advice given to you by your health care provider. Make sure you discuss any questions you have with your health care provider.    

## 2014-10-07 NOTE — Progress Notes (Signed)
Patient ID: Wendy Bautista, female   DOB: 1966-04-17, 48 y.o.   MRN: [id number]    HPI: [name] is a 48 year old female referred by Dr. Ronette Deter for evaluation due to complaints of heartburn, epigastric pain, and diarrhea.  The patient states she has been troubled with heartburn for several years. She uses omeprazole sporadically. With use of omeprazole, her heartburn is fairly well controlled, but she states she sometimes still gets heartburn later in the day even if she does use omeprazole. She has no dysphasia and has no odynophagia. Over the past several months she has been experiencing nausea several days per week. Her nausea is typically worse after meals. She has not vomited. She denies excessive NSAID use. She denies excessive alcohol use. She has no prior history of ulcers or gastritis.  She states that for most of her adult life her stools tend to alternate between days of hard stools alternating with occasional days of loose stools. Over the past several months she has had primarily diarrhea. Approximately 4-6 weeks ago, she began to have watery diarrhea that occurred 8-10 times per day, was moderate in volume, and had nocturnal stooling. Prior to the onset of this diarrhea, she had not traveled outside the country nor had she had any recent antibiotic use. She denies any new pets. She has not been camping. She has no associated oral ulcers, eye pain, joint pain, or skin rashes. As she is adopted, she is unaware of any family history of colon cancer, colon polyps, or inflammatory bowel disease. She has had not had melena or bright red blood per rectum. She has not had any oily stools. She is unable to identify any specific foods that exacerbate her symptoms other than dairy products and red meat. She was recently placed on a FODMAP diet by her primary care physician, but feels it has not made much of a difference thus far. She recently had an abdominal CT scan as well that was unrevealing.  She has not had any anorexia and her weight has been stable. She reports that she will often eat, and then began to have abdominal cramping followed by diarrhea. Over the past week her diarrhea has slowed and she is currently having 1 or 2 mushy to watery bowel movements daily.   Past Medical History  Diagnosis Date  . GERD (gastroesophageal reflux disease)   . Thalassemia trait, beta   . Lichen sclerosus   . B12 deficiency   . Anemia     Betathalassemia Minor  . Hypertension     History reviewed. No pertinent past surgical history. Family History  Problem Relation Age of Onset  . Other      Pt doesn't know history, was adopted   History  Substance Use Topics  . Smoking status: Former Smoker -- 0.50 packs/day for 5 years    Types: Cigarettes    Quit date: 12/13/2000  . Smokeless tobacco: Never Used  . Alcohol Use: 0.6 oz/week    1 Glasses of wine per week     Comment: Occassionally   Current Outpatient Prescriptions  Medication Sig Dispense Refill  . Azelastine-Fluticasone (DYMISTA) 137-50 MCG/ACT SUSP Place 2 puffs into the nose daily.      . cetirizine (ZYRTEC) 10 MG tablet Take 10 mg by mouth daily.      Marland Kitchen desogestrel-ethinyl estradiol (APRI,EMOQUETTE,SOLIA) 0.15-30 MG-MCG tablet Take 1 tablet by mouth daily.      . ondansetron (ZOFRAN-ODT) 8 MG disintegrating tablet Take 1 tablet (8 mg  total) by mouth every 8 (eight) hours as needed for nausea or vomiting.  60 tablet  0  . promethazine (PHENERGAN) 12.5 MG tablet Take 1 tablet (12.5 mg total) by mouth every 6 (six) hours as needed for nausea or vomiting.  30 tablet  0  . dicyclomine (BENTYL) 10 MG capsule Take 1 capsule by mouth three times daily as needed for cramps  90 capsule  3  . pantoprazole (PROTONIX) 40 MG tablet Take 1 tablet by mouth every morning 30 minutes before breakfast      . peg 3350 powder (MOVIPREP) 100 G SOLR Take 1 kit (200 g total) by mouth once.  1 kit  0  . Wheat Dextrin (BENEFIBER) POWD Take 1  tablespoon by mouth in liquid every day    0   No current facility-administered medications for this visit.   Allergies  Allergen Reactions  . Clostebol   . Clobetasol Propionate Rash     Review of Systems: Gen: Denies any fever, chills, sweats, anorexia, fatigue, weakness, malaise, weight loss, and sleep disorder CV: Denies chest pain, angina, palpitations, syncope, orthopnea, PND, peripheral edema, and claudication. Resp: Denies dyspnea at rest, dyspnea with exercise, cough, sputum, wheezing, coughing up blood, and pleurisy. GI: Denies vomiting blood, jaundice, and fecal incontinence.   Denies dysphagia or odynophagia. GU : Denies urinary burning, blood in urine, urinary frequency, urinary hesitancy, nocturnal urination, and urinary incontinence. MS: Denies joint pain, limitation of movement, and swelling, stiffness, low back pain, extremity pain. Denies muscle weakness, cramps, atrophy.  Derm: Denies rash, itching, dry skin, hives, moles, warts, or unhealing ulcers.  Psych: Denies depression, anxiety, memory loss, suicidal ideation, hallucinations, paranoia, and confusion. Heme: Denies bruising, bleeding, and enlarged lymph nodes. Neuro:  Denies any headaches, dizziness, paresthesias. Endo:  Denies any problems with DM, thyroid, adrenal function  Studies: CT of the abdomen and pelvis [facility] 09/17/2014 [facility] showed no acute process in the abdomen or pelvis. No explanation for diarrhea. Suspicion of uterine fibroids.   LAB RESULTS: Stool for C. difficile on 09/06/2014 was negative Stool culture on 09/06/2014 had no salmonella, shigella, Campylobacter, Yersinia, or Escherichia coli isolated. CBC on 02/14/2014 showed white blood cell count of 8.6, hemoglobin 11.2, hematocrit 35.7, platelet count 330,000, MCV 62.2. Patient has a history of thalassemia Celiac panel was ordered in [place] on October 2 but is pending at this time.  Physical Exam: BP  108/62  Pulse 76  Ht $R'5\' 4"'sN$  (1.626 m)  Wt 156 lb (70.761 kg)  BMI 26.76 kg/m2 Constitutional: Pleasant,well-developed,well nourished female in no acute distress. HEENT: Normocephalic and atraumatic. Conjunctivae are normal. No scleral icterus. Neck supple. No thyromegaly Cardiovascular: Normal rate, regular rhythm.  Pulmonary/chest: Effort normal and breath sounds normal. No wheezing, rales or rhonchi. Abdominal: Soft, nondistended, nontender. Bowel sounds active throughout. There are no masses palpable. No hepatomegaly. Extremities: no edema Lymphadenopathy: No cervical adenopathy noted. Neurological: Alert and oriented to person place and time. Skin: Skin is warm and dry. No rashes noted. Psychiatric: Normal mood and affect. Behavior is normal.  ASSESSMENT AND PLAN: #1. GERD and epigastric pain. The patient has been instructed to adhere to an antireflux regimen and she will be given a trial of pantoprazole 40 mg by mouth daily to be taken 30 minutes prior to breakfast. She will discontinue omeprazole. She will be scheduled for an upper endoscopy.The risks, benefits, and alternatives to endoscopy with possible biopsy and possible dilation were discussed with the patient and they  consent to proceed.  #2. Diarrhea. The patient reports a long-standing history of erratic bowel movements that recently have become more diarrhea than constipation. Her symptoms are suggestive of irritable bowel syndrome, however her celiac panel is currently pending. She will be scheduled for a colonoscopy to evaluate for polyps, neoplasia, IBD, or microcytic colitis. The risks, benefits, and alternatives to colonoscopy with possible biopsy and possible polypectomy were discussed with the patient and they consent to proceed. She will also be given a trial of dicyclomine 10 mg 3 times daily when necessary cramping. The patient has been instructed to call us in 2-3 weeks to let us know if the pantoprazole is controlling her  heartburn and the dicyclomine is helping her cramping. Further recommendations will be made pending the findings of her endoscopic evaluations.    Keneth Borg, Vita Barley PA-C 10/07/2014, 11:41 AM

## 2014-10-08 ENCOUNTER — Encounter: Payer: Self-pay | Admitting: Internal Medicine

## 2014-10-09 ENCOUNTER — Ambulatory Visit: Payer: Self-pay

## 2014-10-09 ENCOUNTER — Other Ambulatory Visit: Payer: Self-pay

## 2014-10-09 DIAGNOSIS — D569 Thalassemia, unspecified: Secondary | ICD-10-CM

## 2014-10-09 LAB — COMPREHENSIVE METABOLIC PANEL
ALK PHOS: 85 U/L
Albumin: 3.5 g/dL (ref 3.4–5.0)
Anion Gap: 8 (ref 7–16)
BUN: 7 mg/dL (ref 7–18)
Bilirubin,Total: 0.9 mg/dL (ref 0.2–1.0)
CALCIUM: 8.8 mg/dL (ref 8.5–10.1)
Chloride: 107 mmol/L (ref 98–107)
Co2: 24 mmol/L (ref 21–32)
Creatinine: 1.07 mg/dL (ref 0.60–1.30)
EGFR (African American): >60
EGFR (Non-African Amer.): 58 — ABNORMAL LOW
Glucose: 100 mg/dL — ABNORMAL HIGH (ref 65–99)
OSMOLALITY: 276 (ref 275–301)
Potassium: 3.9 mmol/L (ref 3.5–5.1)
SGOT(AST): 12 U/L — ABNORMAL LOW (ref 15–37)
SGPT (ALT): 15 U/L
Sodium: 139 mmol/L (ref 136–145)
TOTAL PROTEIN: 7.1 g/dL (ref 6.4–8.2)

## 2014-10-09 LAB — CBC WITH DIFFERENTIAL/PLATELET
BASOS ABS: 0 10*3/uL (ref 0.0–0.1)
Basophil %: 0.6 %
EOS ABS: 0.1 10*3/uL (ref 0.0–0.7)
EOS PCT: 1.4 %
HCT: 35.2 % (ref 35.0–47.0)
HGB: 10.9 g/dL — ABNORMAL LOW (ref 12.0–16.0)
LYMPHS ABS: 2.2 10*3/uL (ref 1.0–3.6)
LYMPHS PCT: 30 %
MCH: 19.1 pg — ABNORMAL LOW (ref 26.0–34.0)
MCHC: 31.1 g/dL — ABNORMAL LOW (ref 32.0–36.0)
MCV: 61 fL — ABNORMAL LOW (ref 80–100)
Monocyte #: 0.6 x10 3/mm (ref 0.2–0.9)
Monocyte %: 7.9 %
Neutrophil #: 4.4 10*3/uL (ref 1.4–6.5)
Neutrophil %: 60.1 %
PLATELETS: 305 10*3/uL (ref 150–440)
RBC: 5.74 10*6/uL — ABNORMAL HIGH (ref 3.80–5.20)
RDW: 16.4 % — AB (ref 11.5–14.5)
WBC: 7.3 10*3/uL (ref 3.6–11.0)

## 2014-10-10 ENCOUNTER — Ambulatory Visit: Payer: Self-pay

## 2014-10-10 ENCOUNTER — Encounter: Payer: Self-pay | Admitting: Internal Medicine

## 2014-10-10 LAB — OCCULT BLOOD X 1 CARD TO LAB, STOOL: Occult Blood, Feces: NEGATIVE

## 2014-10-11 ENCOUNTER — Telehealth: Payer: Self-pay | Admitting: *Deleted

## 2014-10-11 NOTE — Telephone Encounter (Signed)
Called patient with lab results from [facility] as per Lawson FiscalLori Hvozdovic, PA-C (celiac negative, iron ok, IGA normal)

## 2014-10-16 ENCOUNTER — Encounter: Payer: Self-pay | Admitting: Physician Assistant

## 2014-10-31 ENCOUNTER — Encounter: Payer: Self-pay | Admitting: Internal Medicine

## 2014-11-12 ENCOUNTER — Encounter: Payer: Self-pay | Admitting: Internal Medicine

## 2014-11-12 ENCOUNTER — Ambulatory Visit (AMBULATORY_SURGERY_CENTER): Payer: BC Managed Care – PPO | Admitting: Internal Medicine

## 2014-11-12 VITALS — BP 118/82 | HR 73 | Temp 98.3°F | Resp 12 | Ht 64.0 in | Wt 156.0 lb

## 2014-11-12 DIAGNOSIS — R194 Change in bowel habit: Secondary | ICD-10-CM

## 2014-11-12 DIAGNOSIS — K219 Gastro-esophageal reflux disease without esophagitis: Secondary | ICD-10-CM

## 2014-11-12 DIAGNOSIS — R1013 Epigastric pain: Secondary | ICD-10-CM

## 2014-11-12 DIAGNOSIS — R197 Diarrhea, unspecified: Secondary | ICD-10-CM

## 2014-11-12 DIAGNOSIS — D509 Iron deficiency anemia, unspecified: Secondary | ICD-10-CM

## 2014-11-12 DIAGNOSIS — G8929 Other chronic pain: Secondary | ICD-10-CM

## 2014-11-12 MED ORDER — SODIUM CHLORIDE 0.9 % IV SOLN: 500.0000 mL | INTRAVENOUS | Status: DC

## 2014-11-12 NOTE — Op Note (Signed)
 [facility] [address] [place], [id number]   COLONOSCOPY PROCEDURE REPORT  PATIENT: [name], Wendy  MR#: [id number] BIRTHDATE: 1966/02/28 , 48  yrs. old GENDER: female ENDOSCOPIST: Roxy CedarJohn N Jamiel Goncalves Jr, MD REFERRED MV:[id number]:Jennifer Dan HumphreysWalker, M.D. PROCEDURE DATE:  11/12/2014 PROCEDURE:   Colonoscopy with biopsy First Screening Colonoscopy - Avg.  risk and is 50 yrs.  old or older - No.  Prior Negative Screening - Now for repeat screening. N/A  History of Adenoma - Now for follow-up colonoscopy & has been > or = to 3 yrs.  N/A  Polyps Removed Today? No.  Recommend repeat exam, <10 yrs? No. ASA CLASS:   Class II INDICATIONS:unexplained diarrhea, change in bowel habits, and abdominal pain.. Doing better since office visit on Benefiber MEDICATIONS: Monitored anesthesia care and Propofol 380 mg IV  DESCRIPTION OF PROCEDURE:   After the risks benefits and alternatives of the procedure were thoroughly explained, informed consent was obtained.  The digital rectal exam revealed no abnormalities of the rectum.   The LB [id number]  endoscope was introduced through the anus and advanced to the cecum, which was identified by both the appendix and ileocecal valve. No adverse events experienced.   The quality of the prep was excellent, using MoviPrep  The instrument was then slowly withdrawn as the colon was fully examined.  COLON FINDINGS: The examined terminal ileum appeared to be normal. A normal appearing cecum, ileocecal valve, and appendiceal orifice were identified.  The ascending, transverse, descending, sigmoid colon, and rectum appeared unremarkable. Random colon biopsies taken.  Retroflexed views revealed no abnormalities. The time to cecum=2 minutes 53 seconds.  Withdrawal time=11 minutes 46 seconds. The scope was withdrawn and the procedure completed. COMPLICATIONS: There were no immediate complications.  ENDOSCOPIC IMPRESSION: 1.   The examined terminal ileum  appeared to be normal 2.   Normal colonoscopy  RECOMMENDATIONS: 1.  Await biopsy results 2.  Upper endoscopy today (see report) 3.  Continue current colorectal screening recommendations for "routine risk" patients with a repeat colonoscopy in 10 years.  eSigned:  Roxy CedarJohn N Amanda Steuart Jr, MD 11/12/2014 4:25 PM   cc: Ronna PolioWalker, Jennifer MD and The Patient

## 2014-11-12 NOTE — Patient Instructions (Signed)

## 2014-11-12 NOTE — Op Note (Signed)
[facility] [address] [place], [id number]   ENDOSCOPY PROCEDURE REPORT  PATIENT: Wendy Bautista, Wendy Bautista  MR#: [id number] BIRTHDATE: 11-Mar-1966 , 48  yrs. old GENDER: female ENDOSCOPIST: Roxy CedarJohn N Perry Jr, MD REFERRED BY:  Ronna PolioJennifer Walker, M.D. PROCEDURE DATE:  11/12/2014 PROCEDURE:  EGD w/ biopsy ASA CLASS:     Class II INDICATIONS:  history of esophageal reflux and epigastric pain. Also, diarrhea and microcytic anemia. Symptoms improved since office physical on PPI MEDICATIONS: Monitored anesthesia care and Propofol 260 mg IV TOPICAL ANESTHETIC: none  DESCRIPTION OF PROCEDURE: After the risks benefits and alternatives of the procedure were thoroughly explained, informed consent was obtained.  The LB [id number] endoscope was introduced through the mouth and advanced to the second portion of the duodenum , Without limitations.  The instrument was slowly withdrawn as the mucosa was fully examined.  EXAM:The esophagus revealed erythema at the gastroesophageal junction consistent with mild reflux esophagitis.  The stomach was normal.  The duodenum was normal.  Post bulbar duodenal biopsies taken.  Retroflexed views revealed a hiatal hernia.     The scope was then withdrawn from the patient and the procedure completed.  COMPLICATIONS: There were no immediate complications.  ENDOSCOPIC IMPRESSION: 1. GERD with mild esophagitis 2. Otherwise normal EGD status post duodenal biopsies  RECOMMENDATIONS: 1.  Anti-reflux regimen to be followed (information provided by recovery room nurse today) 2.  Continue Protonix 40 mg daily 3.  Await biopsy results. Dr. Marina GoodellPerry will send you a letter with biopsy results and further recommendations if needed. If biopsies are unremarkable, then continue on medication and follow-up for a routine checkup in 3 months.  REPEAT EXAM:  eSigned:  Roxy CedarJohn N Perry Jr, MD 11/12/2014 4:36 PM    MW:[id number]:Walker, Victorino DikeJennifer MD and The Patient

## 2014-11-12 NOTE — Progress Notes (Signed)
Procedure ends, to recovery, report given and VSS. 

## 2014-11-12 NOTE — Progress Notes (Signed)
Called to room to assist during endoscopic procedure.  Patient ID and intended procedure confirmed with present staff. Received instructions for my participation in the procedure from the performing physician.  

## 2014-11-13 ENCOUNTER — Telehealth: Payer: Self-pay

## 2014-11-13 NOTE — Telephone Encounter (Signed)
  Follow up Call-  Call back number 11/12/2014  Post procedure Call Back phone  # [phone]  Permission to leave phone message Yes     Patient questions:  Do you have a fever, pain , or abdominal swelling? No. Pain Score  0 *  Have you tolerated food without any problems? Yes.    Have you been able to return to your normal activities? Yes.    Do you have any questions about your discharge instructions: Diet   No. Medications  No. Follow up visit  No.  Do you have questions or concerns about your Care? No.  Actions: * If pain score is 4 or above: No action needed, pain <4.  No problems noted per the pt. maw

## 2014-11-18 ENCOUNTER — Encounter: Payer: Self-pay | Admitting: Internal Medicine

## 2014-11-27 ENCOUNTER — Ambulatory Visit: Payer: BC Managed Care – PPO | Admitting: Internal Medicine

## 2014-11-30 ENCOUNTER — Encounter: Payer: Self-pay | Admitting: Internal Medicine

## 2014-12-19 ENCOUNTER — Ambulatory Visit (INDEPENDENT_AMBULATORY_CARE_PROVIDER_SITE_OTHER): Payer: BLUE CROSS/BLUE SHIELD | Admitting: Nurse Practitioner

## 2014-12-19 ENCOUNTER — Ambulatory Visit (INDEPENDENT_AMBULATORY_CARE_PROVIDER_SITE_OTHER): Payer: BLUE CROSS/BLUE SHIELD

## 2014-12-19 ENCOUNTER — Encounter: Payer: Self-pay | Admitting: Nurse Practitioner

## 2014-12-19 VITALS — BP 110/62 | HR 73 | Temp 98.1°F | Resp 12 | Ht 64.0 in | Wt 159.1 lb

## 2014-12-19 DIAGNOSIS — Z13220 Encounter for screening for lipoid disorders: Secondary | ICD-10-CM

## 2014-12-19 DIAGNOSIS — Z1329 Encounter for screening for other suspected endocrine disorder: Secondary | ICD-10-CM

## 2014-12-19 DIAGNOSIS — D563 Thalassemia minor: Secondary | ICD-10-CM

## 2014-12-19 DIAGNOSIS — Z23 Encounter for immunization: Secondary | ICD-10-CM

## 2014-12-19 DIAGNOSIS — I700 Atherosclerosis of aorta: Secondary | ICD-10-CM

## 2014-12-19 LAB — CBC WITH DIFFERENTIAL/PLATELET
BASOS ABS: 0.1 10*3/uL (ref 0.0–0.1)
Basophils Relative: 0.7 % (ref 0.0–3.0)
Eosinophils Absolute: 0.1 10*3/uL (ref 0.0–0.7)
Eosinophils Relative: 1.6 % (ref 0.0–5.0)
HEMATOCRIT: 36 % (ref 36.0–46.0)
Hemoglobin: 11.1 g/dL — ABNORMAL LOW (ref 12.0–15.0)
Lymphocytes Relative: 31.5 % (ref 12.0–46.0)
Lymphs Abs: 2.5 10*3/uL (ref 0.7–4.0)
MCHC: 30.9 g/dL (ref 30.0–36.0)
MCV: 62 fl — ABNORMAL LOW (ref 78.0–100.0)
MONOS PCT: 7.6 % (ref 3.0–12.0)
Monocytes Absolute: 0.6 10*3/uL (ref 0.1–1.0)
NEUTROS ABS: 4.6 10*3/uL (ref 1.4–7.7)
Neutrophils Relative %: 58.6 % (ref 43.0–77.0)
Platelets: 336 10*3/uL (ref 150.0–400.0)
RBC: 5.8 Mil/uL — ABNORMAL HIGH (ref 3.87–5.11)
RDW: 16.5 % — AB (ref 11.5–15.5)
WBC: 7.9 10*3/uL (ref 4.0–10.5)

## 2014-12-19 LAB — LIPID PANEL
CHOLESTEROL: 214 mg/dL — AB (ref 0–200)
HDL: 51.9 mg/dL (ref >39.00)
LDL Cholesterol: 136 mg/dL — ABNORMAL HIGH (ref 0–99)
NonHDL: 162.1
Total CHOL/HDL Ratio: 4
Triglycerides: 130 mg/dL (ref 0.0–149.0)
VLDL: 26 mg/dL (ref 0.0–40.0)

## 2014-12-19 LAB — TSH: TSH: 2.05 u[IU]/mL (ref 0.35–4.50)

## 2014-12-19 NOTE — Progress Notes (Signed)
Pre visit review using our clinic review tool, if applicable. No additional management support is needed unless otherwise documented below in the visit note. 

## 2014-12-19 NOTE — Patient Instructions (Signed)
Please visit the lab before leaving today.   We will release the results through MyChart and send a message.   We will follow up as needed after the results come back.

## 2014-12-19 NOTE — Progress Notes (Signed)
Subjective:    Patient ID: Wendy Bautista, female    DOB: January 29, 1966, 49 y.o.   MRN: [id number]  HPI  Wendy Bautista is a 49 yo female with a CC of needing to discuss atherosclerosis found on Abdominal CT.   1) CT scan of abdomen by Dr. Dan Humphreys 10/15 for abdominal pain and diarrhea. Patient reports they found atherosclerosis of aorta (abdominal portion) on CT scan. Discussed findings with patient (Liver cyst measuring 9 mm).   Reviewed report of CT with nothing stating this information.   Last lipid panel was 01/09/2013 with elevated Cholesterol and triglycerides. HDL was protective at 53.90.  Patient not currently on a statin.   No current diet or exercise regimen. Body mass index is 27.3 kg/(m^2).   Review of Systems  Constitutional: Negative for fever, chills, diaphoresis, fatigue and unexpected weight change.  Eyes: Negative for visual disturbance.  Respiratory: Negative for chest tightness, shortness of breath and wheezing.   Cardiovascular: Negative for chest pain, palpitations and leg swelling.  Gastrointestinal: Negative for nausea, vomiting, abdominal pain, diarrhea, constipation and blood in stool.  Skin: Negative for color change and rash.    Past Medical History  Diagnosis Date  . GERD (gastroesophageal reflux disease)   . Thalassemia trait, beta   . Lichen sclerosus   . B12 deficiency   . Anemia     Betathalassemia Minor  . Allergy     History   Social History  . Marital Status: Married    Spouse Name: Aliya Sol    Number of Children: 0  . Years of Education: 13   Occupational History  . Insurance    Social History Main Topics  . Smoking status: Former Smoker -- 0.50 packs/day for 5 years    Types: Cigarettes    Quit date: 12/13/2000  . Smokeless tobacco: Never Used  . Alcohol Use: 0.6 oz/week    1 Glasses of wine per week     Comment: Occassionally  . Drug Use: No  . Sexual Activity: Yes   Other Topics Concern  . Not on file   Social History  Narrative  Patient is adopted  Past Surgical History  Procedure Laterality Date  . Wisdom tooth extraction      Family History  Problem Relation Age of Onset  . Adopted: Yes  . Other      Pt doesn't know history, was adopted    Allergies  Allergen Reactions  . Clostebol   . Clobetasol Propionate Rash    Current Outpatient Prescriptions on File Prior to Visit  Medication Sig Dispense Refill  . Azelastine-Fluticasone (DYMISTA) 137-50 MCG/ACT SUSP Place 2 puffs into the nose daily.    . cetirizine (ZYRTEC) 10 MG tablet Take 10 mg by mouth daily.    Marland Kitchen desogestrel-ethinyl estradiol (APRI,EMOQUETTE,SOLIA) 0.15-30 MG-MCG tablet Take 1 tablet by mouth daily.    . Multiple Vitamin (MULTIVITAMIN) capsule Take by mouth.    . pantoprazole (PROTONIX) 40 MG tablet Take 1 tablet by mouth every morning 30 minutes before breakfast    . Wheat Dextrin (BENEFIBER) POWD Take 1 tablespoon by mouth in liquid every day  0   No current facility-administered medications on file prior to visit.       Objective:   Physical Exam  Constitutional: She appears well-developed and well-nourished. No distress.  Cardiovascular: Normal rate and regular rhythm.   Pulmonary/Chest: Effort normal and breath sounds normal.  Skin: Skin is warm and dry. No rash noted. She is not diaphoretic.  Psychiatric: She has a normal mood and affect. Her behavior is normal. Judgment and thought content normal.     BP 110/62 mmHg  Pulse 73  Temp(Src) 98.1 F (36.7 C) (Oral)  Resp 12  Ht 5\' 4"  (1.626 m)  Wt 159 lb 1.9 oz (72.176 kg)  BMI 27.30 kg/m2  SpO2 98%     Assessment & Plan:

## 2014-12-21 NOTE — Assessment & Plan Note (Signed)
Discussed CT findings with patient. Stable. Pt not on Statin currently. Will obtain TSH, CBC w/ diff, and Lipid panel since she is fasting today. FU prn.

## 2014-12-26 ENCOUNTER — Encounter: Payer: Self-pay | Admitting: Nurse Practitioner

## 2015-01-09 ENCOUNTER — Encounter: Payer: Self-pay | Admitting: Internal Medicine

## 2015-01-25 ENCOUNTER — Other Ambulatory Visit: Payer: Self-pay | Admitting: Physician Assistant

## 2015-02-19 ENCOUNTER — Ambulatory Visit (INDEPENDENT_AMBULATORY_CARE_PROVIDER_SITE_OTHER): Payer: BLUE CROSS/BLUE SHIELD | Admitting: Internal Medicine

## 2015-02-19 ENCOUNTER — Encounter: Payer: Self-pay | Admitting: Internal Medicine

## 2015-02-19 VITALS — BP 110/72 | HR 80 | Ht 64.0 in | Wt 162.8 lb

## 2015-02-19 DIAGNOSIS — R1013 Epigastric pain: Secondary | ICD-10-CM

## 2015-02-19 DIAGNOSIS — R197 Diarrhea, unspecified: Secondary | ICD-10-CM

## 2015-02-19 DIAGNOSIS — R194 Change in bowel habit: Secondary | ICD-10-CM

## 2015-02-19 DIAGNOSIS — G8929 Other chronic pain: Secondary | ICD-10-CM

## 2015-02-19 DIAGNOSIS — D509 Iron deficiency anemia, unspecified: Secondary | ICD-10-CM

## 2015-02-19 NOTE — Patient Instructions (Signed)
Please follow up with Dr. Perry in one year 

## 2015-02-19 NOTE — Progress Notes (Signed)
HISTORY OF PRESENT ILLNESS:  Wendy Bautista is a 49 y.o. female who was evaluated in the office 10/07/2014 for change in bowel habits (loose stools) and chronic GERD. She was noted to have anemia with a hemoglobin of 11.1 and MCV 62.0 (she does have beta thalassemia trait) She was placed on fiber supplementation and subsequently underwent colonoscopy and upper endoscopy 11/12/2014. Colonoscopy including intubation of the terminal ileum was normal. Random colon biopsies were unremarkable. Upper endoscopy revealed mild reflux esophagitis and a small hiatal hernia. Duodenal biopsies were obtained and found to be unremarkable. She has continued on fiber supplementation with regular normal bowel habits. She is also continued on pantoprazole 40 mg daily with excellent control of reflux. Occasional regurgitation with overeating. She is taking multivitamin with iron. GI review of systems is entirely negative.  REVIEW OF SYSTEMS:  All non-GI ROS negative upon complete and comprehensive review  Past Medical History  Diagnosis Date  . GERD (gastroesophageal reflux disease)   . Thalassemia trait, beta   . Lichen sclerosus   . B12 deficiency   . Anemia     Betathalassemia Minor  . Allergy     Past Surgical History  Procedure Laterality Date  . Wisdom tooth extraction      Social History Wendy SongJenny Capelli  reports that she quit smoking about 14 years ago. Her smoking use included Cigarettes. She has a 2.5 pack-year smoking history. She has never used smokeless tobacco. She reports that she drinks about 0.6 oz of alcohol per week. She reports that she does not use illicit drugs.  family history includes Other in an other family member. She was adopted.  Allergies  Allergen Reactions  . Clostebol   . Clobetasol Propionate Rash       PHYSICAL EXAMINATION: Vital signs: BP 110/72 mmHg  Pulse 80  Ht 5\' 4"  (1.626 m)  Wt 162 lb 12.8 oz (73.846 kg)  BMI 27.93 kg/m2 General: Well-developed, well-nourished,  no acute distress HEENT: Sclerae are anicteric, conjunctiva pink. Oral mucosa intact Lungs: Clear Heart: Regular Abdomen: soft, nontender, nondistended, no obvious ascites, no peritoneal signs, normal bowel sounds. No organomegaly. Extremities: No edema Psychiatric: alert and oriented x3. Cooperative   ASSESSMENT:  #1. Change in bowel habits and abdominal discomfort. Resolved. Negative colonoscopy with ileoscopy and biopsies #2. GERD with endoscopic evidence of mild esophagitis. Improved with reflux precautions and daily pantoprazole  PLAN:  #1. Continue fiber supplementation #2. Continue pantoprazole 40 mg daily #3. Adherence to reflux precautions #4. Routine office follow-up in 1 year #5. Screening colonoscopy around December 2025  Copies sent to Dr. Dan HumphreysWalker

## 2015-03-28 ENCOUNTER — Ambulatory Visit (INDEPENDENT_AMBULATORY_CARE_PROVIDER_SITE_OTHER): Payer: BLUE CROSS/BLUE SHIELD | Admitting: Nurse Practitioner

## 2015-03-28 ENCOUNTER — Encounter: Payer: Self-pay | Admitting: Nurse Practitioner

## 2015-03-28 VITALS — BP 118/82 | HR 86 | Temp 98.2°F | Resp 12 | Ht 64.0 in | Wt 163.2 lb

## 2015-03-28 DIAGNOSIS — G44219 Episodic tension-type headache, not intractable: Secondary | ICD-10-CM

## 2015-03-28 MED ORDER — PREDNISONE 10 MG PO TABS: ORAL_TABLET | ORAL | Status: DC

## 2015-03-28 MED ORDER — CYCLOBENZAPRINE HCL 5 MG PO TABS: 5.0000 mg | ORAL_TABLET | Freq: Every day | ORAL | Status: DC

## 2015-03-28 NOTE — Progress Notes (Signed)
Pre visit review using our clinic review tool, if applicable. No additional management support is needed unless otherwise documented below in the visit note. 

## 2015-03-28 NOTE — Progress Notes (Signed)
   Subjective:    Patient ID: Wendy SongJenny Bautista, female    DOB: 16-Apr-1966, 49 y.o.   MRN: [id number]  HPI  Wendy Bautista is a 49 yo female with a CC of headache left sided.   New headache  When did they start- 03/21/15  Trauma? Shunt? History of cancer? Denies  Ever had one like this before- Denies  Currently being seen by a neurologist or at a headache clinic- Seen by ENT  Sudden or gradually- Gradually  Location: Left side of head (jaw, temple) Duration: Intermittent  Previous treatments- Prednisone, antibiotic  Sudafed, decongestant- [facility] brand, liquid tylnol  Drainage, sleeping on heating pad  Temprorary fix  Wakes up with jaw clenched unsure if it is from the pain or causing the pain.   Review of Systems  Patient denies the following: Phonophobia, photophobia, sinus pain, eye pain, visual changes, tearing, rhinorrhea, fevers, myalgias, nausea, vomiting, vertigo, neck pain, and neck stiffness.     Objective:   Physical Exam Physical Exam BP 118/82 mmHg  Pulse 86  Temp(Src) 98.2 F (36.8 C) (Oral)  Resp 12  Ht 5\' 4"  (1.626 m)  Wt 163 lb 3.2 oz (74.027 kg)  BMI 28.00 kg/m2  SpO2 97%  HEENT: Scalp/temples non-tender to palpation  Eyes: sclera are clear, no papilledema, no presence of tearing, no visual field defects on exam. PERRLA, EOM intact and without pain.   Ears/Nose: TMs are grey with light reflex, clear canals, turbinates are not boggy or edematous, no evidence of nasal discharge.  Sinus: Non tender to palpation  MSK: Neck has full ROM. Trapezius- tender to palpation, sterno-cleido-mastoid muscles, and cervical vertebrae are non-tender to palpation.   Cardio: No bruits auscultated over carotids  Abdominal: Bowel sounds normal in all four quadrants, no tenderness to palpation, guarding, distention, or masses noted.   Neuro: Negative for nuchal rigidity, See eyes for EOM, Cranial nerves are grossly intact, strength equal in upper and lower extremities bilaterally,  sensation equal bilaterally in face, upper, and lower extremities. DTRs are 2+ bilaterally in upper and lower extremities. Heel, toe, and tandem gait walking is normal. Negative Romberg, pronator drift; finger to nose and rapid alternating movements are intact.       Assessment & Plan:

## 2015-03-28 NOTE — Patient Instructions (Signed)
Prednisone with breakfast  6 tablets on day 1, 5 tablets on day 2, 4 tablets on day 3, 3 tablets on day 4, 2 tablets day 5, 1 tablet on day 6...done!   Try the flexeril to see if it is muscular related.   Call us next week if not helping.

## 2015-04-08 DIAGNOSIS — G4489 Other headache syndrome: Secondary | ICD-10-CM | POA: Insufficient documentation

## 2015-04-08 DIAGNOSIS — R51 Headache: Secondary | ICD-10-CM

## 2015-04-08 DIAGNOSIS — R519 Headache, unspecified: Secondary | ICD-10-CM | POA: Insufficient documentation

## 2015-04-08 NOTE — Assessment & Plan Note (Signed)
Tension headache or jaw pain from clenching (due to tension) will try prednisone taper and flexeril. Asked her to try flexeril first. FU prn worsening/failure to improve.

## 2015-07-09 ENCOUNTER — Other Ambulatory Visit: Payer: Self-pay | Admitting: Internal Medicine

## 2015-10-24 ENCOUNTER — Encounter: Payer: Self-pay | Admitting: Nurse Practitioner

## 2015-10-24 ENCOUNTER — Ambulatory Visit (INDEPENDENT_AMBULATORY_CARE_PROVIDER_SITE_OTHER): Payer: BLUE CROSS/BLUE SHIELD | Admitting: Nurse Practitioner

## 2015-10-24 VITALS — BP 131/84 | HR 83 | Temp 98.2°F | Ht 64.0 in | Wt 170.1 lb

## 2015-10-24 DIAGNOSIS — J069 Acute upper respiratory infection, unspecified: Secondary | ICD-10-CM

## 2015-10-24 MED ORDER — HYDROCOD POLST-CPM POLST ER 10-8 MG/5ML PO SUER: 5.0000 mL | Freq: Every evening | ORAL | Status: DC | PRN

## 2015-10-24 MED ORDER — AMOXICILLIN-POT CLAVULANATE 875-125 MG PO TABS: 1.0000 | ORAL_TABLET | Freq: Two times a day (BID) | ORAL | Status: DC

## 2015-10-24 NOTE — Progress Notes (Signed)
Patient ID: Wendy Bautista, female    DOB: 1966-03-29  Age: 49 y.o. MRN: [id number]  CC: Cough and Nasal Congestion   HPI Wendy Bautista presents for CC of cold symptoms.  1) Nasal congestion and cough x a few weeks.  Last week it worsened and she felt very poorly. Unknown if fever or not.  Denies sick contacts No treatment to date.   History Wendy Bautista has a past medical history of GERD (gastroesophageal reflux disease); Thalassemia trait, beta; Lichen sclerosus; B12 deficiency; Anemia; and Allergy.   She has past surgical history that includes Wisdom tooth extraction.   Her family history includes Other in an other family member. She was adopted.She reports that she quit smoking about 14 years ago. Her smoking use included Cigarettes. She has a 2.5 pack-year smoking history. She has never used smokeless tobacco. She reports that she drinks about 0.6 oz of alcohol per week. She reports that she does not use illicit drugs.  Outpatient Prescriptions Prior to Visit  Medication Sig Dispense Refill  . Azelastine-Fluticasone (DYMISTA) 137-50 MCG/ACT SUSP Place 2 puffs into the nose daily.    . cetirizine (ZYRTEC) 10 MG tablet Take 10 mg by mouth daily.    Marland Kitchen. co-enzyme Q-10 30 MG capsule Take 30 mg by mouth daily.    . Multiple Vitamin (MULTIVITAMIN) capsule Take by mouth.    . omega-3 acid ethyl esters (LOVAZA) 1 G capsule Take by mouth 2 (two) times daily.    . Omega-3 Fatty Acids (OMEGA 3 PO) Take 1 tablet by mouth daily.    . pantoprazole (PROTONIX) 40 MG tablet TAKE 1 TABLET BY MOUTH EVERY MORNING BEFORE BREAKFAST 30 tablet 6  . VIORELE 0.15-0.02/0.01 MG (21/5) tablet   6  . Wheat Dextrin (BENEFIBER) POWD Take 1 tablespoon by mouth in liquid every day  0  . cyclobenzaprine (FLEXERIL) 5 MG tablet Take 1 tablet (5 mg total) by mouth at bedtime. 30 tablet 0  . pantoprazole (PROTONIX) 40 MG tablet Take 1 tablet by mouth every morning 30 minutes before breakfast    . predniSONE (DELTASONE) 10 MG tablet  Take 6 tablets by mouth on day 1 with breakfast, decrease by 1 tablet each day until gone. 21 tablet 0   No facility-administered medications prior to visit.    ROS Review of Systems  Constitutional: Positive for fatigue. Negative for fever, chills and diaphoresis.  HENT: Positive for congestion, ear pain, postnasal drip, rhinorrhea and sinus pressure. Negative for ear discharge, sneezing and sore throat.   Eyes: Negative for visual disturbance.  Respiratory: Positive for cough. Negative for chest tightness and wheezing.   Gastrointestinal: Negative for nausea, vomiting and diarrhea.  Skin: Negative for rash.    Objective:  BP 131/84 mmHg  Pulse 83  Temp(Src) 98.2 F (36.8 C) (Oral)  Ht 5\' 4"  (1.626 m)  Wt 170 lb 2 oz (77.168 kg)  BMI 29.19 kg/m2  SpO2 100%  Physical Exam  Constitutional: She is oriented to person, place, and time. She appears well-developed and well-nourished. No distress.  HENT:  Head: Normocephalic and atraumatic.  Right Ear: External ear normal.  Left Ear: External ear normal.  TM's clear bilaterally  Eyes: EOM are normal. Pupils are equal, round, and reactive to light. Right eye exhibits no discharge. Left eye exhibits no discharge. No scleral icterus.  Neck: Normal range of motion. Neck supple.  Cardiovascular: Normal rate, regular rhythm and normal heart sounds.  Exam reveals no gallop and no friction rub.   No  murmur heard. Pulmonary/Chest: Effort normal and breath sounds normal. No respiratory distress. She has no wheezes. She has no rales. She exhibits no tenderness.  Lymphadenopathy:    She has no cervical adenopathy.  Neurological: She is alert and oriented to person, place, and time. No cranial nerve deficit. She exhibits normal muscle tone. Coordination normal.  Skin: Skin is warm and dry. No rash noted. She is not diaphoretic.  Psychiatric: She has a normal mood and affect. Her behavior is normal. Judgment and thought content normal.    Assessment & Plan:   Wendy Bautista was seen today for cough and nasal congestion.  Diagnoses and all orders for this visit:  Acute URI  Other orders -     amoxicillin-clavulanate (AUGMENTIN) 875-125 MG tablet; Take 1 tablet by mouth 2 (two) times daily. -     chlorpheniramine-HYDROcodone (TUSSIONEX PENNKINETIC ER) 10-8 MG/5ML SUER; Take 5 mLs by mouth at bedtime as needed for cough.  I have discontinued Wendy Bautista predniSONE and cyclobenzaprine. I am also having her start on amoxicillin-clavulanate and chlorpheniramine-HYDROcodone. Additionally, I am having her maintain her cetirizine, Azelastine-Fluticasone, BENEFIBER, multivitamin, co-enzyme Q-10, omega-3 acid ethyl esters, Omega-3 Fatty Acids (OMEGA 3 PO), VIORELE, and pantoprazole.  Meds ordered this encounter  Medications  . amoxicillin-clavulanate (AUGMENTIN) 875-125 MG tablet    Sig: Take 1 tablet by mouth 2 (two) times daily.    Dispense:  14 tablet    Refill:  0    Order Specific Question:  Supervising Provider    Answer:  Duncan Dull L [[id number]]  . chlorpheniramine-HYDROcodone (TUSSIONEX PENNKINETIC ER) 10-8 MG/5ML SUER    Sig: Take 5 mLs by mouth at bedtime as needed for cough.    Dispense:  115 mL    Refill:  0    Order Specific Question:  Supervising Provider    Answer:  Sherlene Shams [[id number]]     Follow-up: Return if symptoms worsen or fail to improve.

## 2015-10-24 NOTE — Progress Notes (Signed)
Pre visit review using our clinic review tool, if applicable. No additional management support is needed unless otherwise documented below in the visit note. 

## 2015-10-24 NOTE — Patient Instructions (Signed)
5 mL (1 teaspoon) of cough syrup. Don't drive or make important decisions while taking this....just sleep and rest.   Augmentin twice daily for 7 days.   Can use nasal rinses, sudafed, or mucinex (plain) with this regimen.   Call if worsening or not helpful in 5-7 days.

## 2015-10-30 DIAGNOSIS — J069 Acute upper respiratory infection, unspecified: Secondary | ICD-10-CM | POA: Insufficient documentation

## 2015-10-30 NOTE — Assessment & Plan Note (Signed)
Tussionex script printed and given to pt to take to pharmacy. Explicit instructions verbalized and on AVS. Augmentin twice daily for 7 days.  Can use nasal rinses, sudafed, or mucinex (plain) with this regimen.  Call if worsening or not helpful in 5-7 days.

## 2015-11-17 ENCOUNTER — Ambulatory Visit (INDEPENDENT_AMBULATORY_CARE_PROVIDER_SITE_OTHER): Payer: BLUE CROSS/BLUE SHIELD

## 2015-11-17 DIAGNOSIS — Z23 Encounter for immunization: Secondary | ICD-10-CM

## 2016-03-01 ENCOUNTER — Other Ambulatory Visit: Payer: Self-pay | Admitting: Internal Medicine

## 2016-05-03 ENCOUNTER — Encounter: Payer: Self-pay | Admitting: Internal Medicine

## 2016-05-03 ENCOUNTER — Ambulatory Visit (INDEPENDENT_AMBULATORY_CARE_PROVIDER_SITE_OTHER): Payer: BLUE CROSS/BLUE SHIELD | Admitting: Internal Medicine

## 2016-05-03 VITALS — BP 130/88 | HR 72 | Ht 64.0 in | Wt 167.2 lb

## 2016-05-03 DIAGNOSIS — K219 Gastro-esophageal reflux disease without esophagitis: Secondary | ICD-10-CM | POA: Diagnosis not present

## 2016-05-03 MED ORDER — PANTOPRAZOLE SODIUM 40 MG PO TBEC: 40.0000 mg | DELAYED_RELEASE_TABLET | Freq: Every day | ORAL | Status: DC

## 2016-05-03 NOTE — Progress Notes (Signed)
HISTORY OF PRESENT ILLNESS:  Wendy Bautista is a 50 y.o. female who was evaluated in this office 10/07/2014 regarding change in bowel habits (loose stools) and chronic GERD. She was noted to be anemic with a hemoglobin of 11.1 and MCV of 62 (she has beta thalassemia trait). Subsequent colonoscopy with ileal intubation was normal as were random colon biopsies. Upper endoscopy revealed mild reflux esophagitis and a small hiatal hernia. Duodenal biopsies were normal. She was placed on pantoprazole and seen in follow-up 02/19/2015. Doing well at that time. She presents today for routine follow-up and medication refill. She continues to do well on pantoprazole 40 mg daily. No dysphagia. No appreciable medication side effects. Bowel habits are regular. She does inquire about the use of probiotic as she is anticipating coming off estrogens.  REVIEW OF SYSTEMS:  All non-GI ROS negative upon comprehensive review  Past Medical History  Diagnosis Date  . GERD (gastroesophageal reflux disease)   . Thalassemia trait, beta   . Lichen sclerosus   . B12 deficiency   . Anemia     Betathalassemia Minor  . Allergy     Past Surgical History  Procedure Laterality Date  . Wisdom tooth extraction      Social History Wendy Bautista  reports that she quit smoking about 15 years ago. Her smoking use included Cigarettes. She has a 2.5 pack-year smoking history. She has never used smokeless tobacco. She reports that she drinks about 0.6 oz of alcohol per week. She reports that she does not use illicit drugs.  family history is not on file. She was adopted.  Allergies  Allergen Reactions  . Clostebol   . Clobetasol Propionate Rash       PHYSICAL EXAMINATION: Vital signs: BP 130/88 mmHg  Pulse 72  Ht 5\' 4"  (1.626 m)  Wt 167 lb 3.2 oz (75.841 kg)  BMI 28.69 kg/m2  Constitutional: generally well-appearing, no acute distress Psychiatric: alert and oriented x3, cooperative Eyes: extraocular movements intact,  anicteric, conjunctiva pink Mouth: oral pharynx moist, no lesions Neck: supple no lymphadenopathy Cardiovascular: heart regular rate and rhythm, no murmur Lungs: clear to auscultation bilaterally Abdomen: soft, nontender, nondistended, no obvious ascites, no peritoneal signs, normal bowel sounds, no organomegaly Rectal:Omitted Extremities: no clubbing cyanosis or lower extremity edema bilaterally Skin: no lesions on visible extremities Neuro: No focal deficits. Cranial nerves intact  ASSESSMENT:  #1. GERD with endoscopic evidence of esophagitis. Asymptomatic on PPI #2. History of change in bowel habits. Resolved. Normal colonoscopy with biopsies 2015   PLAN:  #1. Reflux precautions #2. Continue pantoprazole 40 mg daily. Prescription refilled #3. Discussion today on potential concerns regarding long-term PPI use. Any questions answered #4. Routine GI follow-up one year #5. Routine screening colonoscopy 2025

## 2016-05-03 NOTE — Patient Instructions (Signed)
We have sent the following medications to your pharmacy for you to pick up at your convenience:  Pantoprazole  Please follow up in one year  

## 2016-09-02 DIAGNOSIS — Z1231 Encounter for screening mammogram for malignant neoplasm of breast: Secondary | ICD-10-CM | POA: Diagnosis not present

## 2016-09-28 DIAGNOSIS — Z1321 Encounter for screening for nutritional disorder: Secondary | ICD-10-CM | POA: Diagnosis not present

## 2016-09-28 DIAGNOSIS — Z136 Encounter for screening for cardiovascular disorders: Secondary | ICD-10-CM | POA: Diagnosis not present

## 2016-09-28 DIAGNOSIS — Z131 Encounter for screening for diabetes mellitus: Secondary | ICD-10-CM | POA: Diagnosis not present

## 2016-09-28 DIAGNOSIS — N951 Menopausal and female climacteric states: Secondary | ICD-10-CM | POA: Diagnosis not present

## 2016-10-21 DIAGNOSIS — N920 Excessive and frequent menstruation with regular cycle: Secondary | ICD-10-CM | POA: Diagnosis not present

## 2016-10-25 ENCOUNTER — Ambulatory Visit (INDEPENDENT_AMBULATORY_CARE_PROVIDER_SITE_OTHER): Payer: BLUE CROSS/BLUE SHIELD

## 2016-10-25 DIAGNOSIS — Z23 Encounter for immunization: Secondary | ICD-10-CM | POA: Diagnosis not present

## 2016-12-30 ENCOUNTER — Encounter: Payer: BLUE CROSS/BLUE SHIELD | Admitting: Family

## 2017-01-11 ENCOUNTER — Ambulatory Visit (INDEPENDENT_AMBULATORY_CARE_PROVIDER_SITE_OTHER): Payer: BLUE CROSS/BLUE SHIELD | Admitting: Family

## 2017-01-11 ENCOUNTER — Encounter: Payer: Self-pay | Admitting: Family

## 2017-01-11 VITALS — BP 126/82 | HR 73 | Temp 97.6°F | Ht 64.0 in | Wt 160.4 lb

## 2017-01-11 DIAGNOSIS — D563 Thalassemia minor: Secondary | ICD-10-CM

## 2017-01-11 DIAGNOSIS — I700 Atherosclerosis of aorta: Secondary | ICD-10-CM | POA: Diagnosis not present

## 2017-01-11 DIAGNOSIS — Z0000 Encounter for general adult medical examination without abnormal findings: Secondary | ICD-10-CM

## 2017-01-11 DIAGNOSIS — K2100 Gastro-esophageal reflux disease with esophagitis, without bleeding: Secondary | ICD-10-CM

## 2017-01-11 NOTE — Assessment & Plan Note (Signed)
Elevated cholesterol in context of known atherosclerosis. Will discuss with patient starting statin.

## 2017-01-11 NOTE — Progress Notes (Signed)
Pre visit review using our clinic review tool, if applicable. No additional management support is needed unless otherwise documented below in the visit note. 

## 2017-01-11 NOTE — Assessment & Plan Note (Signed)
Up-to-date on colonoscopy. She is also up-to-date on mammogram. Breast exam performed today. She politely declined pelvic exam and [facility] Lab work done with employment and we discussed these lab findings. Findings were significant for low MCV and elevated cholesterol. Vitamin D, HIV screening were not done with work and we will do this when she comes back for follow-up and recheck her CBC. Encouraged regular exercise. Referral dermatology for annual skin check.

## 2017-01-11 NOTE — Assessment & Plan Note (Signed)
Symptomatically stable, labs also reflect stability. 2 years ago hemoglobin 11.1 ( now 11.4). MCV stayed the same at 62. Will recheck in 6 months and determine if consult to hematology appropriate.

## 2017-01-11 NOTE — Progress Notes (Signed)
Subjective:    Patient ID: Wendy SongJenny Lietz, female    DOB: 13-Nov-1966, 51 y.o.   MRN: [id number]  CC: Wendy Bautista is a 51 y.o. female who presents today for physical exam.    HPI:   Feeling well. 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 No fatigue, dizziness.   GERD- stable. No symptoms on protonix. No trouble swallowing pills.       Colorectal Cancer Screening: UTD 2015; EGD shows GERD, mild esophagitis. Colonoscopy normal, every 10 years.  Breast Cancer Screening: Mammogram UTD 2017 Cervical Cancer Screening: [facility]; annual pap smear. No pelvic complaints.  Bone Health screening/DEXA for 65+: No increased fracture risk. Defer screening at this time. Lung Cancer Screening: Doesn't have 30 year pack year history and age > 55 years.   Immunizations       Tetanus - UTD        HIV Screening- Candidate for  Labs: Screening labs done with work; scanned in and reviewed with patient. No screen for vitamin D.  Exercise: Gets regular exercise.  Alcohol use: None Smoking/tobacco use: former smoker.  Regular dental exams: In need of dental exam. Wears seat belt: Yes. Skin : no new lesions   HISTORY:  Past Medical History:  Diagnosis Date  . Allergy   . Anemia    Betathalassemia Minor  . B12 deficiency   . GERD (gastroesophageal reflux disease)   . Lichen sclerosus   . Thalassemia trait, beta     Past Surgical History:  Procedure Laterality Date  . WISDOM TOOTH EXTRACTION     Family History  Problem Relation Age of Onset  . Adopted: Yes  . Other      Pt doesn't know history, was adopted      ALLERGIES: Clostebol and Clobetasol propionate  Current Outpatient Prescriptions on File Prior to Visit  Medication Sig Dispense Refill  . Azelastine-Fluticasone (DYMISTA) 137-50 MCG/ACT SUSP Place 2 puffs into the nose daily.    . cetirizine (ZYRTEC) 10 MG tablet  Take 10 mg by mouth daily.    Marland Kitchen. co-enzyme Q-10 30 MG capsule Take 30 mg by mouth daily.    . Multiple Vitamin (MULTIVITAMIN) capsule Take by mouth.    . Omega-3 Fatty Acids (OMEGA 3 PO) Take 1 tablet by mouth daily.    . pantoprazole (PROTONIX) 40 MG tablet Take 1 tablet (40 mg total) by mouth daily. 30 tablet 11  . VIORELE 0.15-0.02/0.01 MG (21/5) tablet   6  . Wheat Dextrin (BENEFIBER) POWD Take 1 tablespoon by mouth in liquid every day  0   No current facility-administered medications on file prior to visit.     Social History  Substance Use Topics  . Smoking status: Former Smoker    Packs/day: 0.50    Years: 5.00    Types: Cigarettes    Quit date: 12/13/2000  . Smokeless tobacco: Never Used  . Alcohol use 0.6 oz/week    1 Glasses of wine per week     Comment: Occasionally    Review of Systems  Constitutional: Negative for chills, fatigue, fever and unexpected weight change.  HENT: Negative for congestion.   Respiratory: Negative for cough.   Cardiovascular: Negative for chest pain, palpitations and leg swelling.  Gastrointestinal: Negative for abdominal distention, abdominal pain, nausea and vomiting.  Genitourinary: Negative for dyspareunia, vaginal discharge and vaginal pain.  Musculoskeletal: Negative for arthralgias and  myalgias.  Skin: Negative for rash.  Neurological: Negative for dizziness and headaches.  Hematological: Negative for adenopathy.  Psychiatric/Behavioral: Negative for confusion.      Objective:    BP 126/82   Pulse 73   Temp 97.6 F (36.4 C) (Oral)   Ht 5\' 4"  (1.626 m)   Wt 160 lb 6.4 oz (72.8 kg)   SpO2 98%   BMI 27.53 kg/m   BP Readings from Last 3 Encounters:  01/11/17 126/82  05/03/16 130/88  10/24/15 131/84   Wt Readings from Last 3 Encounters:  01/11/17 160 lb 6.4 oz (72.8 kg)  05/03/16 167 lb 3.2 oz (75.8 kg)  10/24/15 170 lb 2 oz (77.2 kg)    Physical Exam  Constitutional: She appears well-developed and well-nourished.    Eyes: Conjunctivae are normal.  Neck: No thyroid mass and no thyromegaly present.  Cardiovascular: Normal rate, regular rhythm, normal heart sounds and normal pulses.   Pulmonary/Chest: Effort normal and breath sounds normal. She has no wheezes. She has no rhonchi. She has no rales. Right breast exhibits no inverted nipple, no mass, no nipple discharge, no skin change and no tenderness. Left breast exhibits no inverted nipple, no mass, no nipple discharge, no skin change and no tenderness. Breasts are symmetrical.  CBE performed.   Lymphadenopathy:       Head (right side): No submental, no submandibular, no tonsillar, no preauricular, no posterior auricular and no occipital adenopathy present.       Head (left side): No submental, no submandibular, no tonsillar, no preauricular, no posterior auricular and no occipital adenopathy present.    She has no cervical adenopathy.       Right cervical: No superficial cervical, no deep cervical and no posterior cervical adenopathy present.      Left cervical: No superficial cervical, no deep cervical and no posterior cervical adenopathy present.    She has no axillary adenopathy.  Neurological: She is alert.  Skin: Skin is warm and dry.  Psychiatric: She has a normal mood and affect. Her speech is normal and behavior is normal. Thought content normal.  Vitals reviewed.      Assessment & Plan:   Problem List Items Addressed This Visit      Cardiovascular and Mediastinum   Atherosclerosis of aorta (HCC)    Elevated cholesterol in context of known atherosclerosis. Will discuss with patient starting statin.         Digestive   Esophageal reflux    Well controlled. Continue current regimen.         Other   Beta thalassemia minor    Symptomatically stable, labs also reflect stability. 2 years ago hemoglobin 11.1 ( now 11.4). MCV stayed the same at 62. Will recheck in 6 months and determine if consult to hematology appropriate.       Routine  physical examination - Primary    Up-to-date on colonoscopy. She is also up-to-date on mammogram. Breast exam performed today. She politely declined pelvic exam and she [facility]. Lab work done with employment and we discussed these lab findings. Findings were significant for low MCV and elevated cholesterol. Vitamin D, HIV screening were not done with work and we will do this when she comes back for follow-up and recheck her CBC. Encouraged regular exercise. Referral dermatology for annual skin check.      Relevant Orders   Ambulatory referral to Dermatology       I am having Ms. Yetta Barre maintain  her cetirizine, Azelastine-Fluticasone, BENEFIBER, multivitamin, co-enzyme Q-10, Omega-3 Fatty Acids (OMEGA 3 PO), VIORELE, and pantoprazole.   No orders of the defined types were placed in this encounter.   Return precautions given.   Risks, benefits, and alternatives of the medications and treatment plan prescribed today were discussed, and patient expressed understanding.   Education regarding symptom management and diagnosis given to patient on AVS.   Continue to follow with Rennie Plowman, FNP for routine health maintenance.   Wendy Song and I agreed with plan.   Rennie Plowman, FNP

## 2017-01-11 NOTE — Assessment & Plan Note (Signed)
Well-controlled.  Continue current regimen. 

## 2017-01-11 NOTE — Patient Instructions (Signed)
Pleasure meeting you  Follow up 6 months  Health Maintenance, Female Introduction Adopting a healthy lifestyle and getting preventive care can go a long way to promote health and wellness. Talk with your health care provider about what schedule of regular examinations is right for you. This is a good chance for you to check in with your provider about disease prevention and staying healthy. In between checkups, there are plenty of things you can do on your own. Experts have done a lot of research about which lifestyle changes and preventive measures are most likely to keep you healthy. Ask your health care provider for more information. Weight and diet Eat a healthy diet  Be sure to include plenty of vegetables, fruits, low-fat dairy products, and lean protein.  Do not eat a lot of foods high in solid fats, added sugars, or salt.  Get regular exercise. This is one of the most important things you can do for your health.  Most adults should exercise for at least 150 minutes each week. The exercise should increase your heart rate and make you sweat (moderate-intensity exercise).  Most adults should also do strengthening exercises at least twice a week. This is in addition to the moderate-intensity exercise. Maintain a healthy weight  Body mass index (BMI) is a measurement that can be used to identify possible weight problems. It estimates body fat based on height and weight. Your health care provider can help determine your BMI and help you achieve or maintain a healthy weight.  For females 72 years of age and older:  A BMI below 18.5 is considered underweight.  A BMI of 18.5 to 24.9 is normal.  A BMI of 25 to 29.9 is considered overweight.  A BMI of 30 and above is considered obese. Watch levels of cholesterol and blood lipids  You should start having your blood tested for lipids and cholesterol at 49 years of age, then have this test every 5 years.  You may need to have your  cholesterol levels checked more often if:  Your lipid or cholesterol levels are high.  You are older than 50 years of age.  You are at high risk for heart disease. Cancer screening Lung Cancer  Lung cancer screening is recommended for adults 63-67 years old who are at high risk for lung cancer because of a history of smoking.  A yearly low-dose CT scan of the lungs is recommended for people who:  Currently smoke.  Have quit within the past 15 years.  Have at least a 30-pack-year history of smoking. A pack year is smoking an average of one pack of cigarettes a day for 1 year.  Yearly screening should continue until it has been 15 years since you quit.  Yearly screening should stop if you develop a health problem that would prevent you from having lung cancer treatment. Breast Cancer  Practice breast self-awareness. This means understanding how your breasts normally appear and feel.  It also means doing regular breast self-exams. Let your health care provider know about any changes, no matter how small.  If you are in your 20s or 30s, you should have a clinical breast exam (CBE) by a health care provider every 1-3 years as part of a regular health exam.  If you are 72 or older, have a CBE every year. Also consider having a breast X-ray (mammogram) every year.  If you have a family history of breast cancer, talk to your health care provider about genetic screening.  screening.  If you are at high risk for breast cancer, talk to your health care provider about having an MRI and a mammogram every year.  Breast cancer gene (BRCA) assessment is recommended for women who have family members with BRCA-related cancers. BRCA-related cancers include:  Breast.  Ovarian.  Tubal.  Peritoneal cancers.  Results of the assessment will determine the need for genetic counseling and BRCA1 and BRCA2 testing. Cervical Cancer  Your health care provider may recommend that you be screened regularly for  cancer of the pelvic organs (ovaries, uterus, and vagina). This screening involves a pelvic examination, including checking for microscopic changes to the surface of your cervix (Pap test). You may be encouraged to have this screening done every 3 years, beginning at age 21.  For women ages 30-65, health care providers may recommend pelvic exams and Pap testing every 3 years, or they may recommend the Pap and pelvic exam, combined with testing for human papilloma virus (HPV), every 5 years. Some types of HPV increase your risk of cervical cancer. Testing for HPV may also be done on women of any age with unclear Pap test results.  Other health care providers may not recommend any screening for nonpregnant women who are considered low risk for pelvic cancer and who do not have symptoms. Ask your health care provider if a screening pelvic exam is right for you.  If you have had past treatment for cervical cancer or a condition that could lead to cancer, you need Pap tests and screening for cancer for at least 20 years after your treatment. If Pap tests have been discontinued, your risk factors (such as having a new sexual partner) need to be reassessed to determine if screening should resume. Some women have medical problems that increase the chance of getting cervical cancer. In these cases, your health care provider may recommend more frequent screening and Pap tests. Colorectal Cancer  This type of cancer can be detected and often prevented.  Routine colorectal cancer screening usually begins at 50 years of age and continues through 50 years of age.  Your health care provider may recommend screening at an earlier age if you have risk factors for colon cancer.  Your health care provider may also recommend using home test kits to check for hidden blood in the stool.  A small camera at the end of a tube can be used to examine your colon directly (sigmoidoscopy or colonoscopy). This is done to check for  the earliest forms of colorectal cancer.  Routine screening usually begins at age 50.  Direct examination of the colon should be repeated every 5-10 years through 50 years of age. However, you may need to be screened more often if early forms of precancerous polyps or small growths are found. Skin Cancer  Check your skin from head to toe regularly.  Tell your health care provider about any new moles or changes in moles, especially if there is a change in a mole's shape or color.  Also tell your health care provider if you have a mole that is larger than the size of a pencil eraser.  Always use sunscreen. Apply sunscreen liberally and repeatedly throughout the day.  Protect yourself by wearing long sleeves, pants, a wide-brimmed hat, and sunglasses whenever you are outside. Heart disease, diabetes, and high blood pressure  High blood pressure causes heart disease and increases the risk of stroke. High blood pressure is more likely to develop in:  People who have blood pressure   in the high end of the normal range (130-139/85-89 mm Hg).  People who are overweight or obese.  People who are African American.  If you are 18-39 years of age, have your blood pressure checked every 3-5 years. If you are 40 years of age or older, have your blood pressure checked every year. You should have your blood pressure measured twice-once when you are at a hospital or clinic, and once when you are not at a hospital or clinic. Record the average of the two measurements. To check your blood pressure when you are not at a hospital or clinic, you can use:  An automated blood pressure machine at a pharmacy.  A home blood pressure monitor.  If you are between 55 years and 79 years old, ask your health care provider if you should take aspirin to prevent strokes.  Have regular diabetes screenings. This involves taking a blood sample to check your fasting blood sugar level.  If you are at a normal weight and  have a low risk for diabetes, have this test once every three years after 50 years of age.  If you are overweight and have a high risk for diabetes, consider being tested at a younger age or more often. Preventing infection Hepatitis B  If you have a higher risk for hepatitis B, you should be screened for this virus. You are considered at high risk for hepatitis B if:  You were born in a country where hepatitis B is common. Ask your health care provider which countries are considered high risk.  Your parents were born in a high-risk country, and you have not been immunized against hepatitis B (hepatitis B vaccine).  You have HIV or AIDS.  You use needles to inject street drugs.  You live with someone who has hepatitis B.  You have had sex with someone who has hepatitis B.  You get hemodialysis treatment.  You take certain medicines for conditions, including cancer, organ transplantation, and autoimmune conditions. Hepatitis C  Blood testing is recommended for:  Everyone born from 1945 through 1965.  Anyone with known risk factors for hepatitis C. Sexually transmitted infections (STIs)  You should be screened for sexually transmitted infections (STIs) including gonorrhea and chlamydia if:  You are sexually active and are younger than 50 years of age.  You are older than 50 years of age and your health care provider tells you that you are at risk for this type of infection.  Your sexual activity has changed since you were last screened and you are at an increased risk for chlamydia or gonorrhea. Ask your health care provider if you are at risk.  If you do not have HIV, but are at risk, it may be recommended that you take a prescription medicine daily to prevent HIV infection. This is called pre-exposure prophylaxis (PrEP). You are considered at risk if:  You are sexually active and do not regularly use condoms or know the HIV status of your partner(s).  You take drugs by  injection.  You are sexually active with a partner who has HIV. Talk with your health care provider about whether you are at high risk of being infected with HIV. If you choose to begin PrEP, you should first be tested for HIV. You should then be tested every 3 months for as long as you are taking PrEP. Pregnancy  If you are premenopausal and you may become pregnant, ask your health care provider about preconception counseling.  If you   pregnant, take 400 to 800 micrograms (mcg) of folic acid every day.  If you want to prevent pregnancy, talk to your health care provider about birth control (contraception). Osteoporosis and menopause  Osteoporosis is a disease in which the bones lose minerals and strength with aging. This can result in serious bone fractures. Your risk for osteoporosis can be identified using a bone density scan.  If you are 6 years of age or older, or if you are at risk for osteoporosis and fractures, ask your health care provider if you should be screened.  Ask your health care provider whether you should take a calcium or vitamin D supplement to lower your risk for osteoporosis.  Menopause may have certain physical symptoms and risks.  Hormone replacement therapy may reduce some of these symptoms and risks. Talk to your health care provider about whether hormone replacement therapy is right for you. Follow these instructions at home:  Schedule regular health, dental, and eye exams.  Stay current with your immunizations.  Do not use any tobacco products including cigarettes, chewing tobacco, or electronic cigarettes.  If you are pregnant, do not drink alcohol.  If you are breastfeeding, limit how much and how often you drink alcohol.  Limit alcohol intake to no more than 1 drink per day for nonpregnant women. One drink equals 12 ounces of beer, 5 ounces of wine, or 1 ounces of hard liquor.  Do not use street drugs.  Do not share needles.  Ask  your health care provider for help if you need support or information about quitting drugs.  Tell your health care provider if you often feel depressed.  Tell your health care provider if you have ever been abused or do not feel safe at home. This information is not intended to replace advice given to you by your health care provider. Make sure you discuss any questions you have with your health care provider. Document Released: 06/14/2011 Document Revised: 05/06/2016 Document Reviewed: 09/02/2015  2017 [employer]vier

## 2017-01-17 NOTE — Progress Notes (Signed)
Mychart message sent.

## 2017-01-18 DIAGNOSIS — D229 Melanocytic nevi, unspecified: Secondary | ICD-10-CM | POA: Diagnosis not present

## 2017-01-18 DIAGNOSIS — L578 Other skin changes due to chronic exposure to nonionizing radiation: Secondary | ICD-10-CM | POA: Diagnosis not present

## 2017-02-17 DIAGNOSIS — Z01419 Encounter for gynecological examination (general) (routine) without abnormal findings: Secondary | ICD-10-CM | POA: Diagnosis not present

## 2017-02-17 DIAGNOSIS — Z124 Encounter for screening for malignant neoplasm of cervix: Secondary | ICD-10-CM | POA: Diagnosis not present

## 2017-03-24 ENCOUNTER — Ambulatory Visit: Payer: BLUE CROSS/BLUE SHIELD | Admitting: Family Medicine

## 2017-09-07 DIAGNOSIS — Z1231 Encounter for screening mammogram for malignant neoplasm of breast: Secondary | ICD-10-CM | POA: Diagnosis not present

## 2017-09-08 ENCOUNTER — Encounter: Payer: Self-pay | Admitting: Family

## 2017-09-08 ENCOUNTER — Ambulatory Visit (INDEPENDENT_AMBULATORY_CARE_PROVIDER_SITE_OTHER): Payer: BLUE CROSS/BLUE SHIELD | Admitting: Family

## 2017-09-08 VITALS — BP 118/82 | HR 71 | Temp 98.2°F | Ht 64.0 in | Wt 156.2 lb

## 2017-09-08 DIAGNOSIS — K2100 Gastro-esophageal reflux disease with esophagitis, without bleeding: Secondary | ICD-10-CM

## 2017-09-08 DIAGNOSIS — Z23 Encounter for immunization: Secondary | ICD-10-CM

## 2017-09-08 DIAGNOSIS — J309 Allergic rhinitis, unspecified: Secondary | ICD-10-CM | POA: Diagnosis not present

## 2017-09-08 MED ORDER — MONTELUKAST SODIUM 10 MG PO TABS: 10.0000 mg | ORAL_TABLET | Freq: Every day | ORAL | 1 refills | Status: DC

## 2017-09-08 NOTE — Progress Notes (Signed)
Pre visit review using our clinic review tool, if applicable. No additional management support is needed unless otherwise documented below in the visit note. 

## 2017-09-08 NOTE — Progress Notes (Signed)
Subjective:    Patient ID: Wendy Bautista, female    DOB: 31-Mar-1966, 51 y.o.   MRN: [id number]  CC: Wendy Bautista is a 51 y.o. female who presents today for follow up.   HPI: CC: congestion x 2 months, unchanged Constant  'throat clearing', clear runny nasal congestion.  No sinus pain, ear pain, fever, sore throat, trouble swallowing.  Has tried zyrtec with relief however feels very tired afterward. Takes zyrtec on weekends. Has tried nasal spray from [facility] , however dymista helped a lot but too expensive.  Voice hasnt changed. However on the phone at work a lot, and voice feels more hoarse.   Has had allergy testing years ago- dust mites.   GERD- no longer taking prilosec. Occasionally has reflux, but watches 'what she eats' and is controlled.   [facility]      HISTORY:  Past Medical History:  Diagnosis Date  . Allergy   . Anemia    Betathalassemia Minor  . B12 deficiency   . GERD (gastroesophageal reflux disease)   . Lichen sclerosus   . Thalassemia trait, beta    Past Surgical History:  Procedure Laterality Date  . WISDOM TOOTH EXTRACTION     Family History  Problem Relation Age of Onset  . Adopted: Yes  . Other Unknown        Pt doesn't know history, was adopted    Allergies: Clostebol and Clobetasol propionate Current Outpatient Prescriptions on File Prior to Visit  Medication Sig Dispense Refill  . Azelastine-Fluticasone (DYMISTA) 137-50 MCG/ACT SUSP Place 2 puffs into the nose daily.    . cetirizine (ZYRTEC) 10 MG tablet Take 10 mg by mouth daily.    Marland Kitchen co-enzyme Q-10 30 MG capsule Take 30 mg by mouth daily.    . Multiple Vitamin (MULTIVITAMIN) capsule Take by mouth.    . Omega-3 Fatty Acids (OMEGA 3 PO) Take 1 tablet by mouth daily.    Marland Kitchen VIORELE 0.15-0.02/0.01 MG (21/5) tablet   6   No current facility-administered medications on file prior to visit.     Social History  Substance Use Topics  . Smoking status:  Former Smoker    Packs/day: 0.50    Years: 5.00    Types: Cigarettes    Quit date: 12/13/2000  . Smokeless tobacco: Never Used  . Alcohol use 0.6 oz/week    1 Glasses of wine per week     Comment: Occasionally    Review of Systems    Objective:    BP 118/82   Pulse 71   Temp 98.2 F (36.8 C) (Oral)   Ht  (1.626 m)   Wt 156 lb 3.2 oz (70.9 kg)   SpO2 99%   BMI 26.81 kg/m  BP Readings from Last 3 Encounters:  09/08/17 118/82  01/11/17 126/82  05/03/16 130/88   Wt Readings from Last 3 Encounters:  09/08/17 156 lb 3.2 oz (70.9 kg)  01/11/17 160 lb 6.4 oz (72.8 kg)  05/03/16 167 lb 3.2 oz (75.8 kg)    Physical Exam     Assessment & Plan:   Problem List Items Addressed This Visit      Respiratory   Allergic rhinitis    Symptoms consistent with allergies. Discussed conservative regimen at home with the addition of singulair trial. Return precautions given.         Digestive   Esophageal reflux    Controlled with lifestyle measures.  Other Visit Diagnoses    Need for immunization against influenza       Relevant Orders   Flu Vaccine QUAD 36+ mos IM (Completed)       I have discontinued Ms. Hatlestad BENEFIBER and pantoprazole. I am also having her maintain her cetirizine, Azelastine-Fluticasone, multivitamin, co-enzyme Q-10, Omega-3 Fatty Acids (OMEGA 3 PO), and VIORELE.   No orders of the defined types were placed in this encounter.   Return precautions given.   Risks, benefits, and alternatives of the medications and treatment plan prescribed today were discussed, and patient expressed understanding.   Education regarding symptom management and diagnosis given to patient on AVS.  Continue to follow with Allegra Grana, FNP for routine health maintenance.   Wendy Bautista and I agreed with plan.   Rennie Plowman, FNP

## 2017-09-08 NOTE — Patient Instructions (Signed)
Long term use beyond 3 months of proton pump inhibitors , also called PPI's, is associated with malabsorption of vitamins, chronic kidney disease, fracture risk, and diarrheal illnesses. PPI's include Nexium, Prilosec, Protonix, Dexilant, and Prevacid.   I generally recommend trying to control acid reflux with lifestyle modifications including avoiding trigger foods, not eating 2 hours prior to bedtime. You may use histamine 2 blockers daily to twice daily ( this is Zantac, Pepcid) and then when symptoms flare, start back on PPI for short course.   Trial singulair, flonase, nasal saline spray regimen as discussed.  May also take zrytec if you would like with the above regimen however if too drowsey, I would advise not to take  May also try trial of zantac  twice per day one hour before meals to see if this helps at all  Let us know if not better  Allergic Rhinitis Allergic rhinitis is when the mucous membranes in the nose respond to allergens. Allergens are particles in the air that cause your body to have an allergic reaction. This causes you to release allergic antibodies. Through a chain of events, these eventually cause you to release histamine into the blood stream. Although meant to protect the body, it is this release of histamine that causes your discomfort, such as frequent sneezing, congestion, and an itchy, runny nose. What are the causes? Seasonal allergic rhinitis (hay fever) is caused by pollen allergens that may come from grasses, trees, and weeds. Year-round allergic rhinitis (perennial allergic rhinitis) is caused by allergens such as house dust mites, pet dander, and mold spores. What are the signs or symptoms?  Nasal stuffiness (congestion).  Itchy, runny nose with sneezing and tearing of the eyes. How is this diagnosed? Your health care provider can help you determine the allergen or allergens that trigger your symptoms. If you and your health care provider are unable  to determine the allergen, skin or blood testing may be used. Your health care provider will diagnose your condition after taking your health history and performing a physical exam. Your health care provider may assess you for other related conditions, such as asthma, pink eye, or an ear infection. How is this treated? Allergic rhinitis does not have a cure, but it can be controlled by:  Medicines that block allergy symptoms. These may include allergy shots, nasal sprays, and oral antihistamines.  Avoiding the allergen.  Hay fever may often be treated with antihistamines in pill or nasal spray forms. Antihistamines block the effects of histamine. There are over-the-counter medicines that may help with nasal congestion and swelling around the eyes. Check with your health care provider before taking or giving this medicine. If avoiding the allergen or the medicine prescribed do not work, there are many new medicines your health care provider can prescribe. Stronger medicine may be used if initial measures are ineffective. Desensitizing injections can be used if medicine and avoidance does not work. Desensitization is when a patient is given ongoing shots until the body becomes less sensitive to the allergen. Make sure you follow up with your health care provider if problems continue. Follow these instructions at home: It is not possible to completely avoid allergens, but you can reduce your symptoms by taking steps to limit your exposure to them. It helps to know exactly what you are allergic to so that you can avoid your specific triggers. Contact a health care provider if:  You have a fever.  You develop a cough that does not stop easily (persistent).  You have shortness of breath.  You start wheezing.  Symptoms interfere with normal daily activities. This information is not intended to replace advice given to you by your health care provider. Make sure you discuss any questions you have with  your health care provider. Document Released: 08/24/2001 Document Revised: 07/30/2016 Document Reviewed: 08/06/2013 Elsevier Interactive Patient Education  2017 [employer].

## 2017-09-08 NOTE — Assessment & Plan Note (Signed)
Symptoms consistent with allergies. Discussed conservative regimen at home with the addition of singulair trial. Return precautions given.

## 2017-09-08 NOTE — Assessment & Plan Note (Signed)
Controlled with lifestyle measures.

## 2017-12-21 DIAGNOSIS — N920 Excessive and frequent menstruation with regular cycle: Secondary | ICD-10-CM | POA: Diagnosis not present

## 2017-12-29 DIAGNOSIS — N920 Excessive and frequent menstruation with regular cycle: Secondary | ICD-10-CM | POA: Diagnosis not present

## 2017-12-29 DIAGNOSIS — D259 Leiomyoma of uterus, unspecified: Secondary | ICD-10-CM | POA: Diagnosis not present

## 2018-01-11 DIAGNOSIS — N920 Excessive and frequent menstruation with regular cycle: Secondary | ICD-10-CM | POA: Diagnosis not present

## 2018-02-13 LAB — HM PAP SMEAR: HM Pap smear: NORMAL

## 2018-02-23 DIAGNOSIS — Z136 Encounter for screening for cardiovascular disorders: Secondary | ICD-10-CM | POA: Diagnosis not present

## 2018-02-23 DIAGNOSIS — Z01419 Encounter for gynecological examination (general) (routine) without abnormal findings: Secondary | ICD-10-CM | POA: Diagnosis not present

## 2018-02-23 DIAGNOSIS — Z13220 Encounter for screening for lipoid disorders: Secondary | ICD-10-CM | POA: Diagnosis not present

## 2018-02-23 DIAGNOSIS — Z131 Encounter for screening for diabetes mellitus: Secondary | ICD-10-CM | POA: Diagnosis not present

## 2018-02-23 DIAGNOSIS — Z1329 Encounter for screening for other suspected endocrine disorder: Secondary | ICD-10-CM | POA: Diagnosis not present

## 2018-03-30 DIAGNOSIS — R635 Abnormal weight gain: Secondary | ICD-10-CM | POA: Diagnosis not present

## 2018-04-27 DIAGNOSIS — R635 Abnormal weight gain: Secondary | ICD-10-CM | POA: Diagnosis not present

## 2018-05-25 DIAGNOSIS — I10 Essential (primary) hypertension: Secondary | ICD-10-CM | POA: Diagnosis not present

## 2018-06-21 DIAGNOSIS — E782 Mixed hyperlipidemia: Secondary | ICD-10-CM | POA: Diagnosis not present

## 2018-06-21 DIAGNOSIS — D508 Other iron deficiency anemias: Secondary | ICD-10-CM | POA: Diagnosis not present

## 2018-07-25 DIAGNOSIS — I10 Essential (primary) hypertension: Secondary | ICD-10-CM | POA: Diagnosis not present

## 2018-09-11 DIAGNOSIS — Z780 Asymptomatic menopausal state: Secondary | ICD-10-CM | POA: Diagnosis not present

## 2018-09-11 DIAGNOSIS — Z1231 Encounter for screening mammogram for malignant neoplasm of breast: Secondary | ICD-10-CM | POA: Diagnosis not present

## 2018-09-11 DIAGNOSIS — Z9289 Personal history of other medical treatment: Secondary | ICD-10-CM | POA: Diagnosis not present

## 2018-09-11 DIAGNOSIS — Z13820 Encounter for screening for osteoporosis: Secondary | ICD-10-CM | POA: Diagnosis not present

## 2018-09-11 LAB — HM MAMMOGRAPHY: HM Mammogram: NORMAL (ref 0–4)

## 2018-09-11 LAB — HM DEXA SCAN: HM Dexa Scan: NORMAL

## 2018-09-20 DIAGNOSIS — D508 Other iron deficiency anemias: Secondary | ICD-10-CM | POA: Diagnosis not present

## 2018-09-26 DIAGNOSIS — R635 Abnormal weight gain: Secondary | ICD-10-CM | POA: Diagnosis not present

## 2018-11-21 DIAGNOSIS — R635 Abnormal weight gain: Secondary | ICD-10-CM | POA: Diagnosis not present

## 2019-02-27 DIAGNOSIS — Z124 Encounter for screening for malignant neoplasm of cervix: Secondary | ICD-10-CM | POA: Diagnosis not present

## 2019-02-27 DIAGNOSIS — Z01419 Encounter for gynecological examination (general) (routine) without abnormal findings: Secondary | ICD-10-CM | POA: Diagnosis not present

## 2019-02-27 DIAGNOSIS — I10 Essential (primary) hypertension: Secondary | ICD-10-CM | POA: Diagnosis not present

## 2019-03-27 DIAGNOSIS — I10 Essential (primary) hypertension: Secondary | ICD-10-CM | POA: Diagnosis not present

## 2019-08-15 ENCOUNTER — Ambulatory Visit (INDEPENDENT_AMBULATORY_CARE_PROVIDER_SITE_OTHER): Payer: BC Managed Care – PPO | Admitting: Family

## 2019-08-15 ENCOUNTER — Encounter: Payer: Self-pay | Admitting: Family

## 2019-08-15 ENCOUNTER — Other Ambulatory Visit: Payer: Self-pay

## 2019-08-15 VITALS — BP 98/62 | HR 68 | Temp 98.1°F | Wt 134.8 lb

## 2019-08-15 DIAGNOSIS — M79641 Pain in right hand: Secondary | ICD-10-CM

## 2019-08-15 DIAGNOSIS — M79642 Pain in left hand: Secondary | ICD-10-CM

## 2019-08-15 MED ORDER — DICLOFENAC SODIUM 1 % TD GEL: 4.0000 g | Freq: Four times a day (QID) | TRANSDERMAL | 3 refills | Status: DC

## 2019-08-15 MED ORDER — MELOXICAM 7.5 MG PO TABS: 7.5000 mg | ORAL_TABLET | Freq: Every day | ORAL | 1 refills | Status: DC | PRN

## 2019-08-15 NOTE — Progress Notes (Signed)
Subjective:    Patient ID: Wendy Bautista, female    DOB: 1966-10-26, 53 y.o.   MRN: [id number]  CC: Wendy Bautista is a 53 y.o. female who presents today for an acute visit.    HPI: CC: Swelling and pain in left hand, x 5 months, worsening. Most bothersome is 5th left PIP, with some pain on DIP. Swelling and pain with bending and particularly worse after goes on a walk for exercise.  Notes pain over multiple MCPs bilaterally and bilaterally wrist pain for over a year, comes and goes.  Right handed and does work at her computer a lot for work.  No redness, increased heat, numbness, breaks in skin, decrease in grip strength, discoloration in skin, or injury.  No relief with tylenol arthritis.  Adopted.    [facility]; she states mammogram  , pap are UTD.      HISTORY:  Past Medical History:  Diagnosis Date   Allergy    Anemia    Betathalassemia Minor   B12 deficiency    GERD (gastroesophageal reflux disease)    Lichen sclerosus    Thalassemia trait, beta    Past Surgical History:  Procedure Laterality Date   WISDOM TOOTH EXTRACTION     Family History  Adopted: Yes  Problem Relation Age of Onset   Other Other        Pt doesn't know history, was adopted    Allergies: Clostebol and Clobetasol propionate No current outpatient medications on file prior to visit.   No current facility-administered medications on file prior to visit.     Social History   Tobacco Use   Smoking status: Former Smoker    Packs/day: 0.50    Years: 5.00    Pack years: 2.50    Types: Cigarettes    Quit date: 12/13/2000    Years since quitting: 18.6   Smokeless tobacco: Never Used  Substance Use Topics   Alcohol use: Yes    Alcohol/week: 1.0 standard drinks    Types: 1 Glasses of wine per week    Comment: Occasionally   Drug use: No    Review of Systems  Constitutional: Negative for chills and fever.  Respiratory: Negative for  cough.   Cardiovascular: Negative for chest pain and palpitations.  Gastrointestinal: Negative for nausea and vomiting.  Musculoskeletal: Positive for arthralgias and joint swelling. Negative for gait problem.  Skin: Negative for color change and wound.  Neurological: Negative for numbness.      Objective:    BP 98/62    Pulse 68    Temp 98.1 F (36.7 C)    Wt 134 lb 12.8 oz (61.1 kg)    SpO2 96%    BMI 23.14 kg/m    Physical Exam Vitals signs reviewed.  Constitutional:      Appearance: She is well-developed.  Eyes:     Conjunctiva/sclera: Conjunctivae normal.  Cardiovascular:     Rate and Rhythm: Normal rate and regular rhythm.     Pulses: Normal pulses.     Heart sounds: Normal heart sounds.  Pulmonary:     Effort: Pulmonary effort is normal.     Breath sounds: Normal breath sounds. No wheezing, rhonchi or rales.  Musculoskeletal:     Right wrist: She exhibits normal range of motion, no tenderness, no bony tenderness and no swelling.     Left wrist: She exhibits normal range of motion, no tenderness and no bony tenderness.  Left hand: She exhibits decreased range of motion and tenderness. Normal sensation noted. Normal strength noted.       Hands:     Comments: Tenderness is marked on diagram, particularly over left fifth MCP, PIP joint of which joint enlargement noted.  No edema, increased in warmth or erythema noted.  Skin:    General: Skin is warm and dry.  Neurological:     Mental Status: She is alert.  Psychiatric:        Speech: Speech normal.        Behavior: Behavior normal.        Thought Content: Thought content normal.        Assessment & Plan:   Problem List Items Addressed This Visit      Other   Right hand pain    Worsening of late.  Discussed differentials including osteoarthritis versus rheumatoid arthritis.  Pending x-rays, autoimmune studies and also referral to rheumatology.  We discussed our plain to go ahead and place referral to  rheumatology as if she appears seronegative, index of suspicion is still rather high based on the bilateral presentation of her pain in the joints involved, particularly MCP, PIP joints.  We do not have a family history .trial of Voltaren gel, and Mobic.       Other Visit Diagnoses    Bilateral hand pain    -  Primary   Relevant Medications   meloxicam (MOBIC) 7.5 MG tablet   diclofenac sodium (VOLTAREN) 1 % GEL   Other Relevant Orders   ANA   C-reactive protein   CYCLIC CITRUL PEPTIDE ANTIBODY, IGG/IGA   Rheumatoid factor (Completed)   Sedimentation rate   CBC with Differential/Platelet   Ambulatory referral to Rheumatology   DG Wrist Complete Right   DG Hand Complete Right   DG Hand Complete Left   DG Wrist Complete Left         I have discontinued Boneta LucksJenny Wass's cetirizine, Azelastine-Fluticasone, multivitamin, co-enzyme Q-10, Omega-3 Fatty Acids (OMEGA 3 PO), Viorele, and montelukast. I am also having her start on meloxicam and diclofenac sodium.   Meds ordered this encounter  Medications   meloxicam (MOBIC) 7.5 MG tablet    Sig: Take 1 tablet (7.5 mg total) by mouth daily as needed for pain.    Dispense:  30 tablet    Refill:  1    Order Specific Question:   Supervising Provider    Answer:   Duncan DullULLO, TERESA L [[id number]]   diclofenac sodium (VOLTAREN) 1 % GEL    Sig: Apply 4 g topically 4 (four) times daily.    Dispense:  50 g    Refill:  3    Order Specific Question:   Supervising Provider    Answer:   Sherlene ShamsULLO, TERESA L [[id number]]    Return precautions given.   Risks, benefits, and alternatives of the medications and treatment plan prescribed today were discussed, and patient expressed understanding.   Education regarding symptom management and diagnosis given to patient on AVS.  Continue to follow with [name], [name], FNP for routine health maintenance.   Michaelene SongJenny Coyne and I agreed with plan.   Rennie PlowmanMargaret Maggie Dworkin, FNP

## 2019-08-15 NOTE — Patient Instructions (Signed)
Trial of mobic A couple of points in regards to meloxicam ( Mobic) which you are on.   This medication is not intended for daily , long term use. It is a potent anti inflammatory ( NSAID), and my intention is for you take as needed for moderate to severe pain. If you find yourself using daily, please let me know.   Please takes Mobic ( meloxicam) with FOOD since it is an anti-inflammatory as it can cause a GI bleed or ulcer. If you have a history of GI bleed or ulcer, please do NOT take.  Do no take over the counter aleve, motrin, advil, goody's powder for pain as they are also NSAIDs, and they are  in the same class as Mobic  Lastly, we will need to monitor kidney function while on Mobic wer, and if we were to see any decline in kidney function in the future, we would have to discontinue this medication.    Trial of voltaren gel  Today we discussed referrals, orders. rheumatology   I have placed these orders in the system for you.  Please be sure to give [place] a call if you have not heard from our office regarding this. We should hear from [place] within ONE week with information regarding your appointment. If not, please let me know immediately.    Stay safe!

## 2019-08-16 ENCOUNTER — Encounter: Payer: Self-pay | Admitting: Radiology

## 2019-08-16 ENCOUNTER — Ambulatory Visit (INDEPENDENT_AMBULATORY_CARE_PROVIDER_SITE_OTHER)
Admission: RE | Admit: 2019-08-16 | Discharge: 2019-08-16 | Disposition: A | Discharge status: Home / Self Care | Payer: BC Managed Care – PPO | Source: Ambulatory Visit | Attending: Family | Admitting: Family

## 2019-08-16 DIAGNOSIS — M79642 Pain in left hand: Secondary | ICD-10-CM | POA: Diagnosis not present

## 2019-08-16 DIAGNOSIS — M25531 Pain in right wrist: Secondary | ICD-10-CM | POA: Diagnosis not present

## 2019-08-16 DIAGNOSIS — M25532 Pain in left wrist: Secondary | ICD-10-CM | POA: Diagnosis not present

## 2019-08-16 DIAGNOSIS — M79641 Pain in right hand: Secondary | ICD-10-CM

## 2019-08-16 LAB — CBC WITH DIFFERENTIAL/PLATELET
Basophils Absolute: 0.1 10*3/uL (ref 0.0–0.1)
Basophils Relative: 1.1 % (ref 0.0–3.0)
Eosinophils Absolute: 0.1 10*3/uL (ref 0.0–0.7)
Eosinophils Relative: 1.6 % (ref 0.0–5.0)
HCT: 34.4 % — ABNORMAL LOW (ref 36.0–46.0)
Hemoglobin: 10.7 g/dL — ABNORMAL LOW (ref 12.0–15.0)
Lymphocytes Relative: 38.9 % (ref 12.0–46.0)
Lymphs Abs: 3 10*3/uL (ref 0.7–4.0)
MCHC: 31.1 g/dL (ref 30.0–36.0)
MCV: 61.5 fl — ABNORMAL LOW (ref 78.0–100.0)
Monocytes Absolute: 0.5 10*3/uL (ref 0.1–1.0)
Monocytes Relative: 7.1 % (ref 3.0–12.0)
Neutro Abs: 4 10*3/uL (ref 1.4–7.7)
Neutrophils Relative %: 51.3 % (ref 43.0–77.0)
Platelets: 329 10*3/uL (ref 150.0–400.0)
RBC: 5.6 Mil/uL — ABNORMAL HIGH (ref 3.87–5.11)
RDW: 17.2 % — ABNORMAL HIGH (ref 11.5–15.5)
WBC: 7.7 10*3/uL (ref 4.0–10.5)

## 2019-08-16 LAB — C-REACTIVE PROTEIN: CRP: <1 mg/dL (ref 0.5–20.0)

## 2019-08-16 LAB — RHEUMATOID FACTOR: Rheumatoid fact SerPl-aCnc: <14 IU/mL (ref <14)

## 2019-08-16 LAB — ANA: Anti Nuclear Antibody (ANA): NEGATIVE

## 2019-08-16 LAB — SEDIMENTATION RATE: Sed Rate: 14 mm/hr (ref 0–30)

## 2019-08-16 NOTE — Assessment & Plan Note (Signed)
Worsening of late.  Discussed differentials including osteoarthritis versus rheumatoid arthritis.  Pending x-rays, autoimmune studies and also referral to rheumatology.  We discussed our plain to go ahead and place referral to rheumatology as if she appears seronegative, index of suspicion is still rather high based on the bilateral presentation of her pain in the joints involved, particularly MCP, PIP joints.  We do not have a family history .trial of Voltaren gel, and Mobic.

## 2019-08-17 ENCOUNTER — Other Ambulatory Visit: Payer: Self-pay | Admitting: Family

## 2019-08-17 ENCOUNTER — Encounter: Payer: Self-pay | Admitting: Family

## 2019-08-17 DIAGNOSIS — D563 Thalassemia minor: Secondary | ICD-10-CM

## 2019-08-17 DIAGNOSIS — D649 Anemia, unspecified: Secondary | ICD-10-CM

## 2019-08-17 LAB — CYCLIC CITRUL PEPTIDE ANTIBODY, IGG/IGA: Cyclic Citrullin Peptide Ab: 3 units (ref 0–19)

## 2019-08-22 DIAGNOSIS — I10 Essential (primary) hypertension: Secondary | ICD-10-CM | POA: Diagnosis not present

## 2019-08-24 NOTE — Progress Notes (Signed)
Patient completed Echeck in 

## 2019-08-27 ENCOUNTER — Other Ambulatory Visit: Payer: Self-pay

## 2019-08-27 ENCOUNTER — Inpatient Hospital Stay: Payer: BC Managed Care – PPO

## 2019-08-27 ENCOUNTER — Encounter: Payer: Self-pay | Admitting: Oncology

## 2019-08-27 ENCOUNTER — Inpatient Hospital Stay: Payer: BC Managed Care – PPO | Attending: Oncology | Admitting: Oncology

## 2019-08-27 VITALS — BP 109/70 | Temp 97.7°F | Wt 133.0 lb

## 2019-08-27 DIAGNOSIS — R5383 Other fatigue: Secondary | ICD-10-CM | POA: Insufficient documentation

## 2019-08-27 DIAGNOSIS — D563 Thalassemia minor: Secondary | ICD-10-CM | POA: Insufficient documentation

## 2019-08-27 DIAGNOSIS — D509 Iron deficiency anemia, unspecified: Secondary | ICD-10-CM

## 2019-08-27 DIAGNOSIS — E538 Deficiency of other specified B group vitamins: Secondary | ICD-10-CM

## 2019-08-27 DIAGNOSIS — Z87891 Personal history of nicotine dependence: Secondary | ICD-10-CM | POA: Insufficient documentation

## 2019-08-27 LAB — CBC WITH DIFFERENTIAL/PLATELET
Abs Immature Granulocytes: 0.02 10*3/uL (ref 0.00–0.07)
Basophils Absolute: 0 10*3/uL (ref 0.0–0.1)
Basophils Relative: 1 %
Eosinophils Absolute: 0.1 10*3/uL (ref 0.0–0.5)
Eosinophils Relative: 1 %
HCT: 34.7 % — ABNORMAL LOW (ref 36.0–46.0)
Hemoglobin: 10.4 g/dL — ABNORMAL LOW (ref 12.0–15.0)
Immature Granulocytes: 0 %
Lymphocytes Relative: 43 %
Lymphs Abs: 2.5 10*3/uL (ref 0.7–4.0)
MCH: 18.9 pg — ABNORMAL LOW (ref 26.0–34.0)
MCHC: 30 g/dL (ref 30.0–36.0)
MCV: 63.2 fL — ABNORMAL LOW (ref 80.0–100.0)
Monocytes Absolute: 0.5 10*3/uL (ref 0.1–1.0)
Monocytes Relative: 8 %
Neutro Abs: 2.6 10*3/uL (ref 1.7–7.7)
Neutrophils Relative %: 47 %
Platelets: 282 10*3/uL (ref 150–400)
RBC: 5.49 MIL/uL — ABNORMAL HIGH (ref 3.87–5.11)
RDW: 17.1 % — ABNORMAL HIGH (ref 11.5–15.5)
WBC: 5.7 10*3/uL (ref 4.0–10.5)
nRBC: 0 % (ref 0.0–0.2)

## 2019-08-27 LAB — FOLATE: Folate: 20.8 ng/mL (ref >5.9)

## 2019-08-27 LAB — IRON AND TIBC
Iron: 96 ug/dL (ref 28–170)
Saturation Ratios: 28 % (ref 10.4–31.8)
TIBC: 343 ug/dL (ref 250–450)
UIBC: 247 ug/dL

## 2019-08-27 LAB — TSH: TSH: 1.891 u[IU]/mL (ref 0.350–4.500)

## 2019-08-27 LAB — TECHNOLOGIST SMEAR REVIEW: Plt Morphology: NORMAL

## 2019-08-27 LAB — FERRITIN: Ferritin: 106 ng/mL (ref 11–307)

## 2019-08-27 LAB — VITAMIN B12: Vitamin B-12: 326 pg/mL (ref 180–914)

## 2019-08-28 LAB — HEMOGLOBINOPATHY EVALUATION
Hgb A2 Quant: 4.8 % — ABNORMAL HIGH (ref 1.8–3.2)
Hgb A: 93.9 % — ABNORMAL LOW (ref 96.4–98.8)
Hgb C: 0 %
Hgb F Quant: 1.3 % (ref 0.0–2.0)
Hgb S Quant: 0 %
Hgb Variant: 0 %

## 2019-08-28 NOTE — Progress Notes (Signed)
Hematology/Oncology Consult note [facility] [phone] [phone]   Wendy Bautista Care Team: [name], Margaret G, FNP as PCP - General (Family Medicine)  REFERRING PROVIDER: [name], Margaret G, FNP  CHIEF COMPLAINTS/REASON FOR VISIT:  Evaluation of beta thalassemia  HISTORY OF PRESENTING ILLNESS:   Wendy Bautista is a  53 y.o.  female with PMH listed below was seen in consultation at the request of  Allegra Granarnett, Margaret G, FNP  for evaluation of beta thalassemia Wendy Bautista reports being informed that Wendy Bautista has a form of beta thalassemia long time ago. Recalls to be chronically anemic.  Reviewed her previous blood work, Wendy Bautista has a baseline hemoglobin in low 11's and high 10's. Recently Wendy Bautista had blood work done at the primary care provider's office. Hemoglobin was 10.4.  Reports some chronic fatigue.  Otherwise doing well. Good appetite. Denies any chest pain, shortness of breath, abdominal pain, leg swelling. Wendy Bautista has had upper endoscopy done on 11/12/2014 due to history of esophageal reflux and esophageal pain. Findings showed GERD with mild esophagitis.  Otherwise normal EGD status post duodenal biopsies. Colonoscopy showed normal findings.  Review of Systems  Constitutional: Positive for fatigue. Negative for appetite change, chills and fever.  HENT:   Negative for hearing loss and voice change.   Eyes: Negative for eye problems.  Respiratory: Negative for chest tightness and cough.   Cardiovascular: Negative for chest pain.  Gastrointestinal: Negative for abdominal distention, abdominal pain and blood in stool.  Endocrine: Negative for hot flashes.  Genitourinary: Negative for difficulty urinating and frequency.   Musculoskeletal: Negative for arthralgias.  Skin: Negative for itching and rash.  Neurological: Negative for extremity weakness.  Hematological: Negative for adenopathy.  Psychiatric/Behavioral: Negative for confusion.    MEDICAL  HISTORY:  Past Medical History:  Diagnosis Date  . Allergy   . Anemia    Betathalassemia Minor  . B12 deficiency   . GERD (gastroesophageal reflux disease)   . Lichen sclerosus   . Thalassemia trait, beta     SURGICAL HISTORY: Past Surgical History:  Procedure Laterality Date  . WISDOM TOOTH EXTRACTION      SOCIAL HISTORY: Social History   Socioeconomic History  . Marital status: Married    Spouse name: Kyra LeylandRonald Dicke  . Number of children: 0  . Years of education: 3913  . Highest education level: Not on file  Occupational History  . Occupation: [employer]  Social Needs  . Financial resource strain: Not on file  . Food insecurity    Worry: Not on file    Inability: Not on file  . Transportation needs    Medical: Not on file    Non-medical: Not on file  Tobacco Use  . Smoking status: Former Smoker    Packs/day: 0.50    Years: 5.00    Pack years: 2.50    Types: Cigarettes    Quit date: 12/13/2000    Years since quitting: 18.7  . Smokeless tobacco: Never Used  Substance and Sexual Activity  . Alcohol use: Not Currently    Alcohol/week: 1.0 standard drinks    Types: 1 Glasses of wine per week    Comment: Occasionally  . Drug use: No  . Sexual activity: Yes  Lifestyle  . Physical activity    Days per week: Not on file    Minutes per session: Not on file  . Stress: Not on file  Relationships  . Social Musicianconnections    Talks on phone: Not on file    Gets  together: Not on file    Attends religious service: Not on file    Active member of club or organization: Not on file    Attends meetings of clubs or organizations: Not on file    Relationship status: Not on file  . Intimate partner violence    Fear of current or ex partner: Not on file    Emotionally abused: Not on file    Physically abused: Not on file    Forced sexual activity: Not on file  Other Topics Concern  . Not on file  Social History Narrative   Works at [employer], no children        FAMILY HISTORY: Family History  Adopted: Yes  Problem Relation Age of Onset  . Other Other        Pt doesn't know history, was adopted    ALLERGIES:  is allergic to clostebol and clobetasol propionate.  MEDICATIONS:  Current Outpatient Medications  Medication Sig Dispense Refill  . diclofenac sodium (VOLTAREN) 1 % GEL Apply 4 g topically 4 (four) times daily. 50 g 3  . meloxicam (MOBIC) 7.5 MG tablet Take 1 tablet (7.5 mg total) by mouth daily as needed for pain. 30 tablet 1   No current facility-administered medications for this visit.      PHYSICAL EXAMINATION: ECOG PERFORMANCE STATUS: 0 - Asymptomatic Vitals:   08/27/19 0941  BP: 109/70  Temp: 97.7 F (36.5 C)   Filed Weights   08/27/19 0941  Weight: 133 lb (60.3 kg)    Physical Exam Constitutional:      General: Wendy Bautista is not in acute distress. HENT:     Head: Normocephalic and atraumatic.  Eyes:     General: No scleral icterus.    Pupils: Pupils are equal, round, and reactive to light.  Neck:     Musculoskeletal: Normal range of motion and neck supple.  Cardiovascular:     Rate and Rhythm: Normal rate and regular rhythm.     Heart sounds: Normal heart sounds.  Pulmonary:     Effort: Pulmonary effort is normal. No respiratory distress.     Breath sounds: No wheezing.  Abdominal:     General: Bowel sounds are normal. There is no distension.     Palpations: Abdomen is soft. There is no mass.     Tenderness: There is no abdominal tenderness.  Musculoskeletal: Normal range of motion.        General: No deformity.  Skin:    General: Skin is warm and dry.     Findings: No erythema or rash.  Neurological:     Mental Status: Wendy Bautista is alert and oriented to person, place, and time.     Cranial Nerves: No cranial nerve deficit.     Coordination: Coordination normal.  Psychiatric:        Behavior: Behavior normal.        Thought Content: Thought content normal.     LABORATORY DATA:  I have reviewed the data as  listed Lab Results  Component Value Date   WBC 5.7 08/27/2019   HGB 10.4 (L) 08/27/2019   HCT 34.7 (L) 08/27/2019   MCV 63.2 (L) 08/27/2019   PLT 282 08/27/2019   No results for input(s): NA, K, CL, CO2, GLUCOSE, BUN, CREATININE, CALCIUM, GFRNONAA, GFRAA, PROT, ALBUMIN, AST, ALT, ALKPHOS, BILITOT, BILIDIR, IBILI in the last 8760 hours. Iron/TIBC/Ferritin/ %Sat    Component Value Date/Time   IRON 96 08/27/2019 1016   TIBC 343 08/27/2019 1016  FERRITIN 106 08/27/2019 1016   IRONPCTSAT 28 08/27/2019 1016      RADIOGRAPHIC STUDIES: I have personally reviewed the radiological images as listed and agreed with the findings in the report.  Dg Wrist Complete Left  Result Date: 08/16/2019 CLINICAL DATA:  Bilateral wrist pain. EXAM: LEFT WRIST - COMPLETE 3+ VIEW COMPARISON:  None. FINDINGS: There is no evidence of fracture or dislocation. There is no evidence of arthropathy or other focal bone abnormality. Soft tissues are unremarkable. IMPRESSION: Negative. Electronically Signed   By: Marijo Conception M.D.   On: 08/16/2019 09:02   Dg Wrist Complete Right  Result Date: 08/16/2019 CLINICAL DATA:  Bilateral wrist pain. EXAM: RIGHT WRIST - COMPLETE 3+ VIEW COMPARISON:  None. FINDINGS: There is no evidence of fracture or dislocation. There is no evidence of arthropathy or other focal bone abnormality. Soft tissues are unremarkable. IMPRESSION: Negative. Electronically Signed   By: Marijo Conception M.D.   On: 08/16/2019 08:58   Dg Hand Complete Left  Result Date: 08/16/2019 CLINICAL DATA:  Bilateral hand pain. EXAM: LEFT HAND - COMPLETE 3+ VIEW COMPARISON:  None. FINDINGS: There is no evidence of fracture or dislocation. There is no evidence of arthropathy or other focal bone abnormality. Soft tissues are unremarkable. IMPRESSION: Negative. Electronically Signed   By: Marijo Conception M.D.   On: 08/16/2019 09:00   Dg Hand Complete Right  Result Date: 08/16/2019 CLINICAL DATA:  Bilateral hand pain.  EXAM: RIGHT HAND - COMPLETE 3+ VIEW COMPARISON:  None. FINDINGS: There is no evidence of fracture or dislocation. There is no evidence of arthropathy or other focal bone abnormality. Soft tissues are unremarkable. IMPRESSION: Negative. Electronically Signed   By: Marijo Conception M.D.   On: 08/16/2019 08:59      ASSESSMENT & PLAN:  1. Beta thalassemia minor   2. Other fatigue    Labs reviewed and discussed with Wendy Bautista. Chronic microcytic anemia.  With history of beta thalassemia minor. Recommend obtaining CBC, B12, iron TIBC, ferritin, folate, TSH, We will also obtain hemoglobinopathy evaluation.  -Labs reviewed.  Peripheral smear showed ovalocytes/mixed RBC population.  Normal platelet and WBC morphology. Hemoglobin 10.4, microcytic.  MCV 63.2 RDW is increased at 17.1. Normal TSH at 1.89, Folate level 20.8 vitamin B12 level 326. .  Iron panel showed iron saturation and 28, ferritin 106.  No iron deficiency. Hemoglobinopathy pattern is consistent with beta thalassemia minor.  #Discussed with Wendy Bautista that her anemia is mild and thalassemia minor typically do not require any interventions for anemia or other deviations from routine medical care.  Continue observation.     Orders Placed This Encounter  Procedures  . CBC with Differential/Platelet    Standing Status:   Future    Number of Occurrences:   1    Standing Expiration Date:   08/26/2020  . Hemoglobinopathy evaluation    Standing Status:   Future    Number of Occurrences:   1    Standing Expiration Date:   08/26/2020  . Vitamin B12    Standing Status:   Future    Number of Occurrences:   1    Standing Expiration Date:   08/26/2020  . Iron and TIBC    Standing Status:   Future    Number of Occurrences:   1    Standing Expiration Date:   08/26/2020  . Ferritin    Standing Status:   Future    Number of Occurrences:   1  Standing Expiration Date:   08/26/2020  . Folate    Standing Status:   Future    Number of  Occurrences:   1    Standing Expiration Date:   08/26/2020  . Technologist smear review    Standing Status:   Future    Number of Occurrences:   1    Standing Expiration Date:   08/26/2020  . TSH    Standing Status:   Future    Number of Occurrences:   1    Standing Expiration Date:   08/26/2020  . CBC with Differential/Platelet    Standing Status:   Future    Number of Occurrences:   1    Standing Expiration Date:   08/26/2021    All questions were answered. The Wendy Bautista knows to call the clinic with any problems questions or concerns.  cc [name], Margaret G, FNP    Return of visit: 1 year.  Thank you for this kind referral and the opportunity to participate in the care of this Wendy Bautista. A copy of today's note is routed to referring provider  Total face to face encounter time for this Wendy Bautista visit was 45 min. >50% of the time was  spent in counseling and coordination of care.    Rickard PatienceZhou Caylan Schifano, MD, PhD Hematology Oncology [facility] Pager- [phone] 08/28/2019

## 2019-08-29 ENCOUNTER — Ambulatory Visit: Payer: BC Managed Care – PPO | Admitting: Family Medicine

## 2019-09-06 DIAGNOSIS — M79642 Pain in left hand: Secondary | ICD-10-CM | POA: Diagnosis not present

## 2019-09-06 DIAGNOSIS — M2012 Hallux valgus (acquired), left foot: Secondary | ICD-10-CM | POA: Insufficient documentation

## 2019-09-06 DIAGNOSIS — M2011 Hallux valgus (acquired), right foot: Secondary | ICD-10-CM | POA: Diagnosis not present

## 2019-09-06 DIAGNOSIS — M79641 Pain in right hand: Secondary | ICD-10-CM | POA: Diagnosis not present

## 2019-09-18 DIAGNOSIS — Z1231 Encounter for screening mammogram for malignant neoplasm of breast: Secondary | ICD-10-CM | POA: Diagnosis not present

## 2020-02-27 DIAGNOSIS — Z01419 Encounter for gynecological examination (general) (routine) without abnormal findings: Secondary | ICD-10-CM | POA: Diagnosis not present

## 2020-03-13 DIAGNOSIS — Z131 Encounter for screening for diabetes mellitus: Secondary | ICD-10-CM | POA: Diagnosis not present

## 2020-03-13 DIAGNOSIS — Z13220 Encounter for screening for lipoid disorders: Secondary | ICD-10-CM | POA: Diagnosis not present

## 2020-03-13 DIAGNOSIS — Z136 Encounter for screening for cardiovascular disorders: Secondary | ICD-10-CM | POA: Diagnosis not present

## 2020-03-13 DIAGNOSIS — Z1321 Encounter for screening for nutritional disorder: Secondary | ICD-10-CM | POA: Diagnosis not present

## 2020-03-13 DIAGNOSIS — Z1329 Encounter for screening for other suspected endocrine disorder: Secondary | ICD-10-CM | POA: Diagnosis not present

## 2020-03-19 ENCOUNTER — Encounter: Payer: Self-pay | Admitting: Nurse Practitioner

## 2020-04-01 ENCOUNTER — Ambulatory Visit: Payer: BC Managed Care – PPO | Admitting: Nurse Practitioner

## 2020-04-24 DIAGNOSIS — R635 Abnormal weight gain: Secondary | ICD-10-CM | POA: Diagnosis not present

## 2020-08-20 ENCOUNTER — Telehealth: Payer: Self-pay | Admitting: Oncology

## 2020-08-20 NOTE — Telephone Encounter (Signed)
Patient phoned on this date and stated that she was not aware of appt on 08-25-20 and could not attend. Patient rescheduled appts to 09-03-20.

## 2020-08-25 ENCOUNTER — Other Ambulatory Visit: Payer: BC Managed Care – PPO

## 2020-08-25 ENCOUNTER — Ambulatory Visit: Payer: BC Managed Care – PPO | Admitting: Oncology

## 2020-09-02 ENCOUNTER — Other Ambulatory Visit: Payer: Self-pay

## 2020-09-02 DIAGNOSIS — D563 Thalassemia minor: Secondary | ICD-10-CM

## 2020-09-03 ENCOUNTER — Inpatient Hospital Stay: Payer: BC Managed Care – PPO | Attending: Oncology

## 2020-09-03 ENCOUNTER — Other Ambulatory Visit: Payer: Self-pay

## 2020-09-03 ENCOUNTER — Inpatient Hospital Stay (HOSPITAL_BASED_OUTPATIENT_CLINIC_OR_DEPARTMENT_OTHER): Payer: BC Managed Care – PPO | Admitting: Oncology

## 2020-09-03 ENCOUNTER — Encounter: Payer: Self-pay | Admitting: Oncology

## 2020-09-03 VITALS — BP 103/68 | HR 62 | Temp 97.1°F | Resp 18 | Wt 131.4 lb

## 2020-09-03 DIAGNOSIS — D563 Thalassemia minor: Secondary | ICD-10-CM

## 2020-09-03 DIAGNOSIS — D509 Iron deficiency anemia, unspecified: Secondary | ICD-10-CM | POA: Insufficient documentation

## 2020-09-03 DIAGNOSIS — Z87891 Personal history of nicotine dependence: Secondary | ICD-10-CM | POA: Diagnosis not present

## 2020-09-03 LAB — CBC WITH DIFFERENTIAL/PLATELET
Abs Immature Granulocytes: 0.02 10*3/uL (ref 0.00–0.07)
Basophils Absolute: 0.1 10*3/uL (ref 0.0–0.1)
Basophils Relative: 1 %
Eosinophils Absolute: 0.1 10*3/uL (ref 0.0–0.5)
Eosinophils Relative: 2 %
HCT: 33.7 % — ABNORMAL LOW (ref 36.0–46.0)
Hemoglobin: 10.8 g/dL — ABNORMAL LOW (ref 12.0–15.0)
Immature Granulocytes: 0 %
Lymphocytes Relative: 36 %
Lymphs Abs: 3.1 10*3/uL (ref 0.7–4.0)
MCH: 19.5 pg — ABNORMAL LOW (ref 26.0–34.0)
MCHC: 32 g/dL (ref 30.0–36.0)
MCV: 60.9 fL — ABNORMAL LOW (ref 80.0–100.0)
Monocytes Absolute: 0.7 10*3/uL (ref 0.1–1.0)
Monocytes Relative: 8 %
Neutro Abs: 4.5 10*3/uL (ref 1.7–7.7)
Neutrophils Relative %: 53 %
Platelets: 298 10*3/uL (ref 150–400)
RBC: 5.53 MIL/uL — ABNORMAL HIGH (ref 3.87–5.11)
RDW: 17.1 % — ABNORMAL HIGH (ref 11.5–15.5)
WBC: 8.5 10*3/uL (ref 4.0–10.5)
nRBC: 0 % (ref 0.0–0.2)

## 2020-09-03 NOTE — Progress Notes (Signed)
Hematology/Oncology Consult note [facility] [phone] [phone]   Patient Care Team: Allegra Grana, FNP as PCP - General (Family Medicine)  REFERRING PROVIDER: Allegra Grana, FNP  CHIEF COMPLAINTS/REASON FOR VISIT:  Follow-up for beta thalassemia  HISTORY OF PRESENTING ILLNESS:   Wendy Bautista is a  54 y.o.  female with PMH listed below was seen in consultation at the request of  Allegra Grana, FNP  for evaluation of beta thalassemia Patient reports being informed that she has a form of beta thalassemia long time ago. Recalls to be chronically anemic.  Reviewed her previous blood work, she has a baseline hemoglobin in low 11's and high 10's. Recently she had blood work done at the primary care provider's office. Hemoglobin was 10.4.  Reports some chronic fatigue.  Otherwise doing well. Good appetite. Denies any chest pain, shortness of breath, abdominal pain, leg swelling. Patient has had upper endoscopy done on 11/12/2014 due to history of esophageal reflux and esophageal pain. Findings showed GERD with mild esophagitis.  Otherwise normal EGD status post duodenal biopsies. Colonoscopy showed normal findings.  INTERVAL HISTORY Wendy Bautista is a 54 y.o. female who has above history reviewed by me today presents for follow up visit for management of thalassemia, chronic anemia Problems and complaints are listed below: Patient denies any new complaints.  Review of Systems  Constitutional: Positive for fatigue. Negative for appetite change, chills and fever.  HENT:   Negative for hearing loss and voice change.   Eyes: Negative for eye problems.  Respiratory: Negative for chest tightness and cough.   Cardiovascular: Negative for chest pain.  Gastrointestinal: Negative for abdominal distention, abdominal pain and blood in stool.  Endocrine: Negative for hot flashes.  Genitourinary: Negative for difficulty urinating and  frequency.   Musculoskeletal: Negative for arthralgias.  Skin: Negative for itching and rash.  Neurological: Negative for extremity weakness.  Hematological: Negative for adenopathy.  Psychiatric/Behavioral: Negative for confusion.    MEDICAL HISTORY:  Past Medical History:  Diagnosis Date  . Allergy   . Anemia    Betathalassemia Minor  . B12 deficiency   . GERD (gastroesophageal reflux disease)   . Lichen sclerosus   . Thalassemia trait, beta     SURGICAL HISTORY: Past Surgical History:  Procedure Laterality Date  . WISDOM TOOTH EXTRACTION      SOCIAL HISTORY: Social History   Socioeconomic History  . Marital status: Married    Spouse name: Kaniah Rizzolo  . Number of children: 0  . Years of education: 65  . Highest education level: Not on file  Occupational History  . Occupation: Community education officer  Tobacco Use  . Smoking status: Former Smoker    Packs/day: 0.50    Years: 5.00    Pack years: 2.50    Types: Cigarettes    Quit date: 12/13/2000    Years since quitting: 19.7  . Smokeless tobacco: Never Used  Substance and Sexual Activity  . Alcohol use: Not Currently    Alcohol/week: 1.0 standard drink    Types: 1 Glasses of wine per week    Comment: Occasionally  . Drug use: No  . Sexual activity: Yes  Other Topics Concern  . Not on file  Social History Narrative   Works at [employer], no children      Social Determinants of Corporate investment banker Strain:   . Difficulty of Paying Living Expenses: Not on file  Food Insecurity:   . Worried About  Running Out of Food in the Last Year: Not on file  . Ran Out of Food in the Last Year: Not on file  Transportation Needs:   . Lack of Transportation (Medical): Not on file  . Lack of Transportation (Non-Medical): Not on file  Physical Activity:   . Days of Exercise per Week: Not on file  . Minutes of Exercise per Session: Not on file  Stress:   . Feeling of Stress : Not on file  Social Connections:   .  Frequency of Communication with Friends and Family: Not on file  . Frequency of Social Gatherings with Friends and Family: Not on file  . Attends Religious Services: Not on file  . Active Member of Clubs or Organizations: Not on file  . Attends Banker Meetings: Not on file  . Marital Status: Not on file  Intimate Partner Violence:   . Fear of Current or Ex-Partner: Not on file  . Emotionally Abused: Not on file  . Physically Abused: Not on file  . Sexually Abused: Not on file    FAMILY HISTORY: Family History  Adopted: Yes  Problem Relation Age of Onset  . Other Other        Pt doesn't know history, was adopted    ALLERGIES:  is allergic to clostebol and clobetasol propionate.  MEDICATIONS:  Current Outpatient Medications  Medication Sig Dispense Refill  . cetirizine (ZYRTEC) 10 MG tablet Take by mouth. Take 10 mg by mouth once daily as needed    . Multiple Vitamin (MULTIVITAMIN) capsule Take by mouth. Take by mouth once daily     No current facility-administered medications for this visit.     PHYSICAL EXAMINATION: ECOG PERFORMANCE STATUS: 0 - Asymptomatic Vitals:   09/03/20 1433  BP: 103/68  Pulse: 62  Resp: 18  Temp: (!) 97.1 F (36.2 C)   Filed Weights   09/03/20 1433  Weight: 131 lb 6.4 oz (59.6 kg)    Physical Exam Constitutional:      General: She is not in acute distress. HENT:     Head: Normocephalic and atraumatic.  Eyes:     General: No scleral icterus.    Pupils: Pupils are equal, round, and reactive to light.  Cardiovascular:     Rate and Rhythm: Normal rate and regular rhythm.     Heart sounds: Normal heart sounds.  Pulmonary:     Effort: Pulmonary effort is normal. No respiratory distress.     Breath sounds: No wheezing.  Abdominal:     General: Bowel sounds are normal. There is no distension.     Palpations: Abdomen is soft. There is no mass.     Tenderness: There is no abdominal tenderness.  Musculoskeletal:         General: No deformity. Normal range of motion.     Cervical back: Normal range of motion and neck supple.  Skin:    General: Skin is warm and dry.     Findings: No erythema or rash.  Neurological:     Mental Status: She is alert and oriented to person, place, and time.     Cranial Nerves: No cranial nerve deficit.     Coordination: Coordination normal.  Psychiatric:        Behavior: Behavior normal.        Thought Content: Thought content normal.     LABORATORY DATA:  I have reviewed the data as listed Lab Results  Component Value Date   WBC 8.5 09/03/2020  HGB 10.8 (L) 09/03/2020   HCT 33.7 (L) 09/03/2020   MCV 60.9 (L) 09/03/2020   PLT 298 09/03/2020   No results for input(s): NA, K, CL, CO2, GLUCOSE, BUN, CREATININE, CALCIUM, GFRNONAA, GFRAA, PROT, ALBUMIN, AST, ALT, ALKPHOS, BILITOT, BILIDIR, IBILI in the last 8760 hours. Iron/TIBC/Ferritin/ %Sat    Component Value Date/Time   IRON 96 08/27/2019 1016   TIBC 343 08/27/2019 1016   FERRITIN 106 08/27/2019 1016   IRONPCTSAT 28 08/27/2019 1016       ASSESSMENT & PLAN:  1. Beta thalassemia minor    #Beta thalassemia minor, Labs reviewed and discussed with patient. Chronic microcytic anemia, Hemoglobin has been stable. Previous work-up showed normal TSH, normal folate and vitamin B12 level. Normal iron store. Currently her hemoglobin remained stable, slightly improved I recommend patient to continue multivitamin supplementation. Anemia is mild and thalassemia minor typically do not require any intervention Continue observation. I will discharge patient from my clinic.  Recommend patient to continue follow-up with primary care provider Happy to see her in the future if she develops related concerns or symptoms. All questions were answered. The patient knows to call the clinic with any problems questions or concerns.  cc Arnett, Lyn Records, FNP    Rickard Patience, MD, PhD Hematology Oncology [facility]  at [facility] Pager- [phone] 09/03/2020

## 2020-09-03 NOTE — Progress Notes (Signed)
Pt here for follow up. No new concerns voiced.   

## 2020-10-03 DIAGNOSIS — Z1231 Encounter for screening mammogram for malignant neoplasm of breast: Secondary | ICD-10-CM | POA: Diagnosis not present

## 2020-12-26 ENCOUNTER — Ambulatory Visit: Payer: BC Managed Care – PPO | Admitting: Family

## 2021-01-03 IMAGING — DX DG WRIST COMPLETE 3+V*R*
4 series · 4 of 4 positions shown · non-contrast
Comparison: None.

CLINICAL DATA: Bilateral wrist pain.

EXAM:
RIGHT WRIST - COMPLETE 3+ VIEW

[wrist ap]
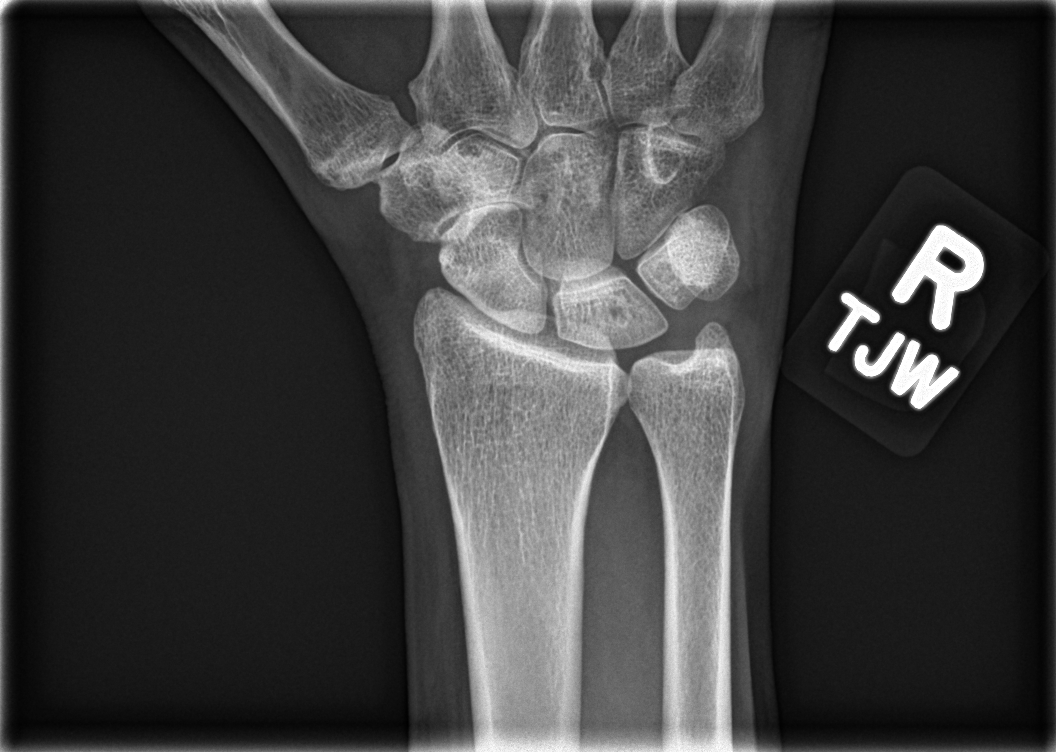

[wrist obl]
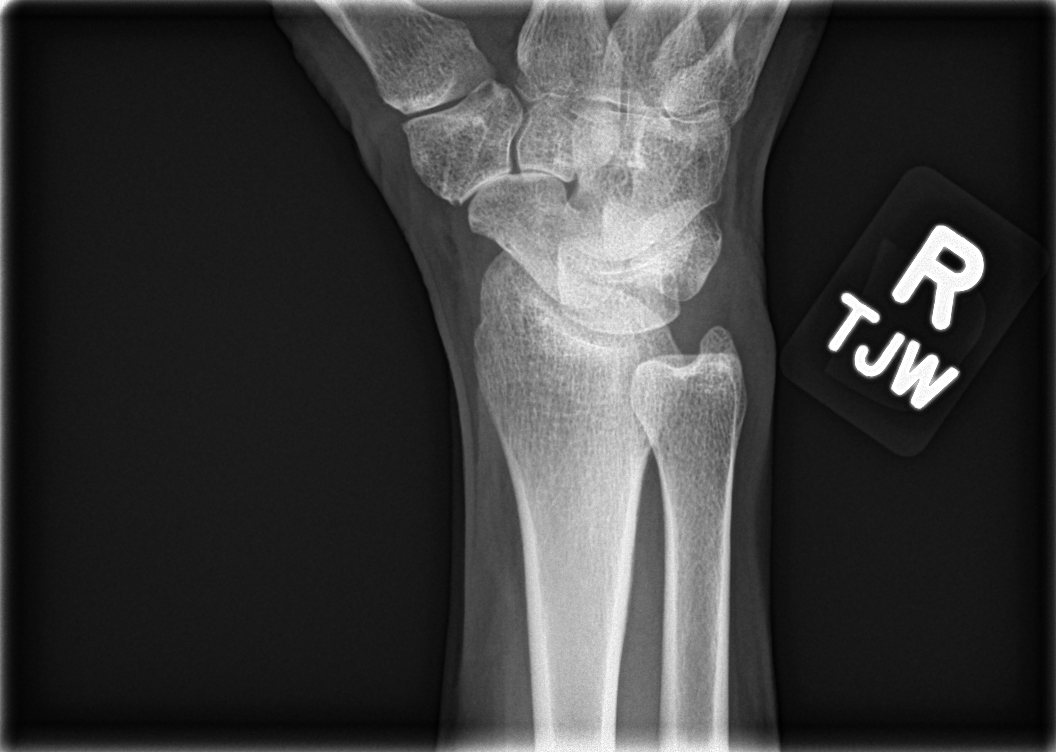

[wrist lat]
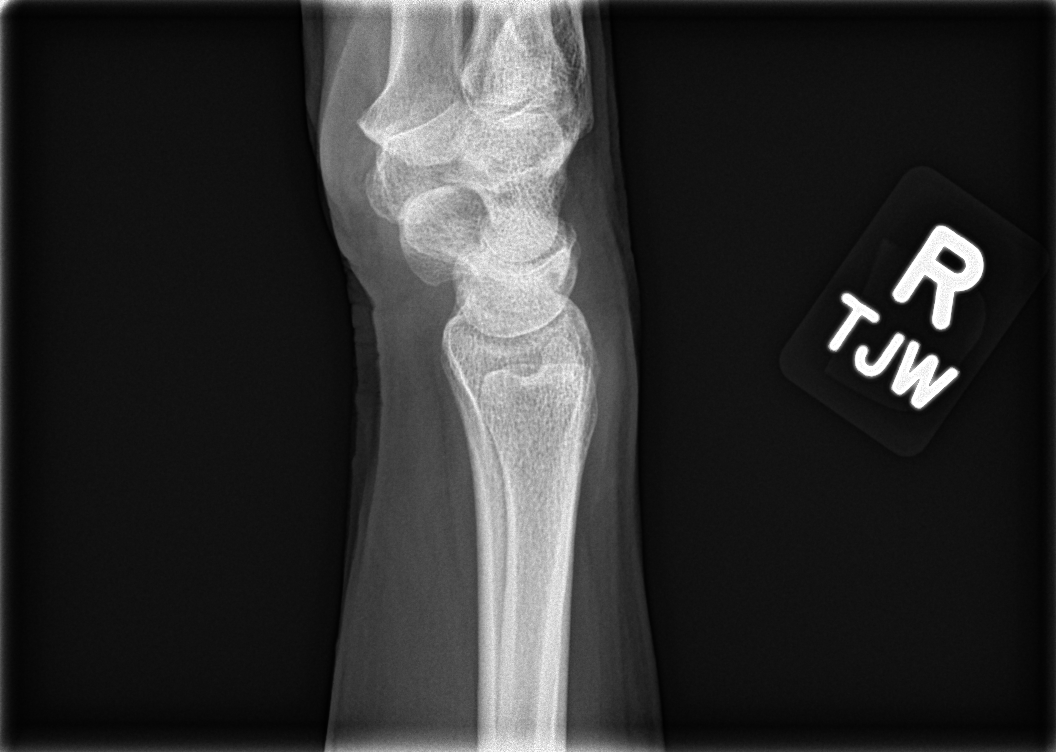

[wrist pa]
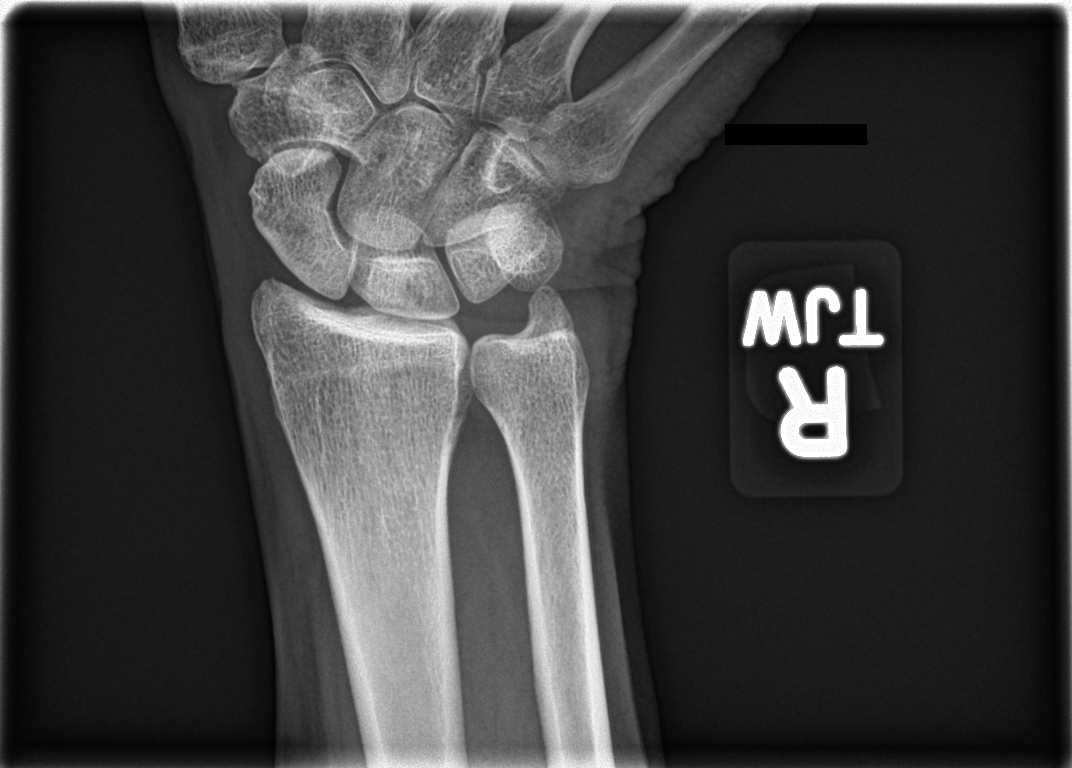

[4 of 4 positions shown; findings below may reference images not displayed]

FINDINGS: There is no evidence of fracture or dislocation. There is no
evidence of arthropathy or other focal bone abnormality. Soft
tissues are unremarkable.
IMPRESSION: Negative.

## 2021-01-03 IMAGING — DX DG HAND COMPLETE 3+V*L*
3 series · 3 of 3 positions shown · non-contrast
Comparison: None.

CLINICAL DATA: Bilateral hand pain.

EXAM:
LEFT HAND - COMPLETE 3+ VIEW

[hand ap]
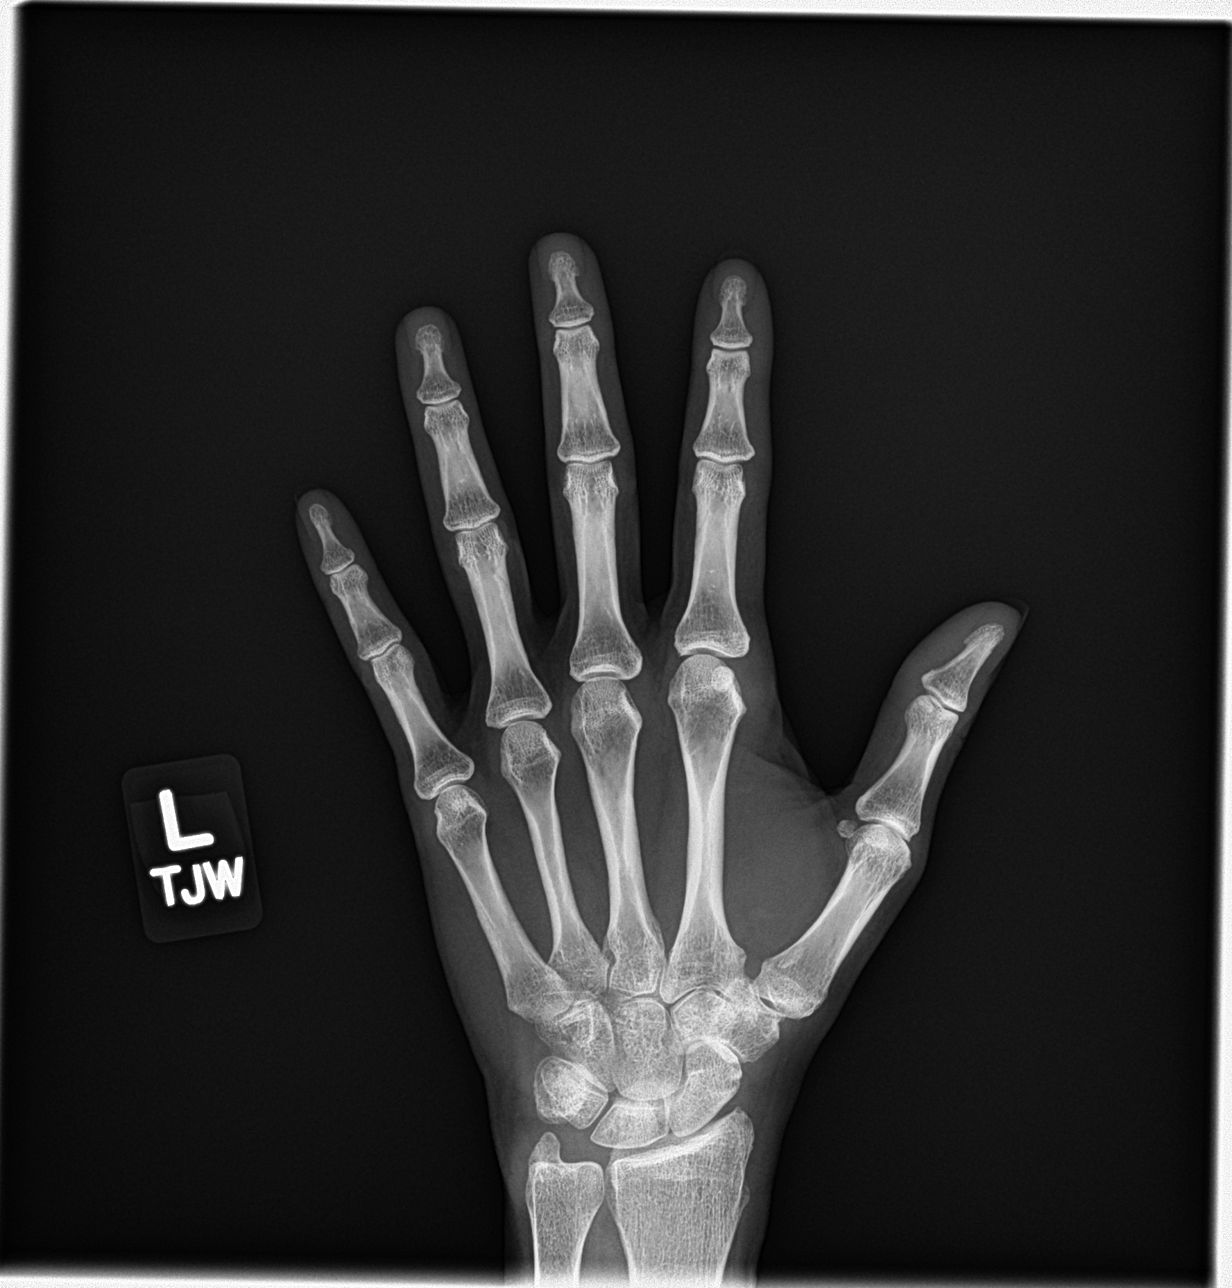

[hand obl]
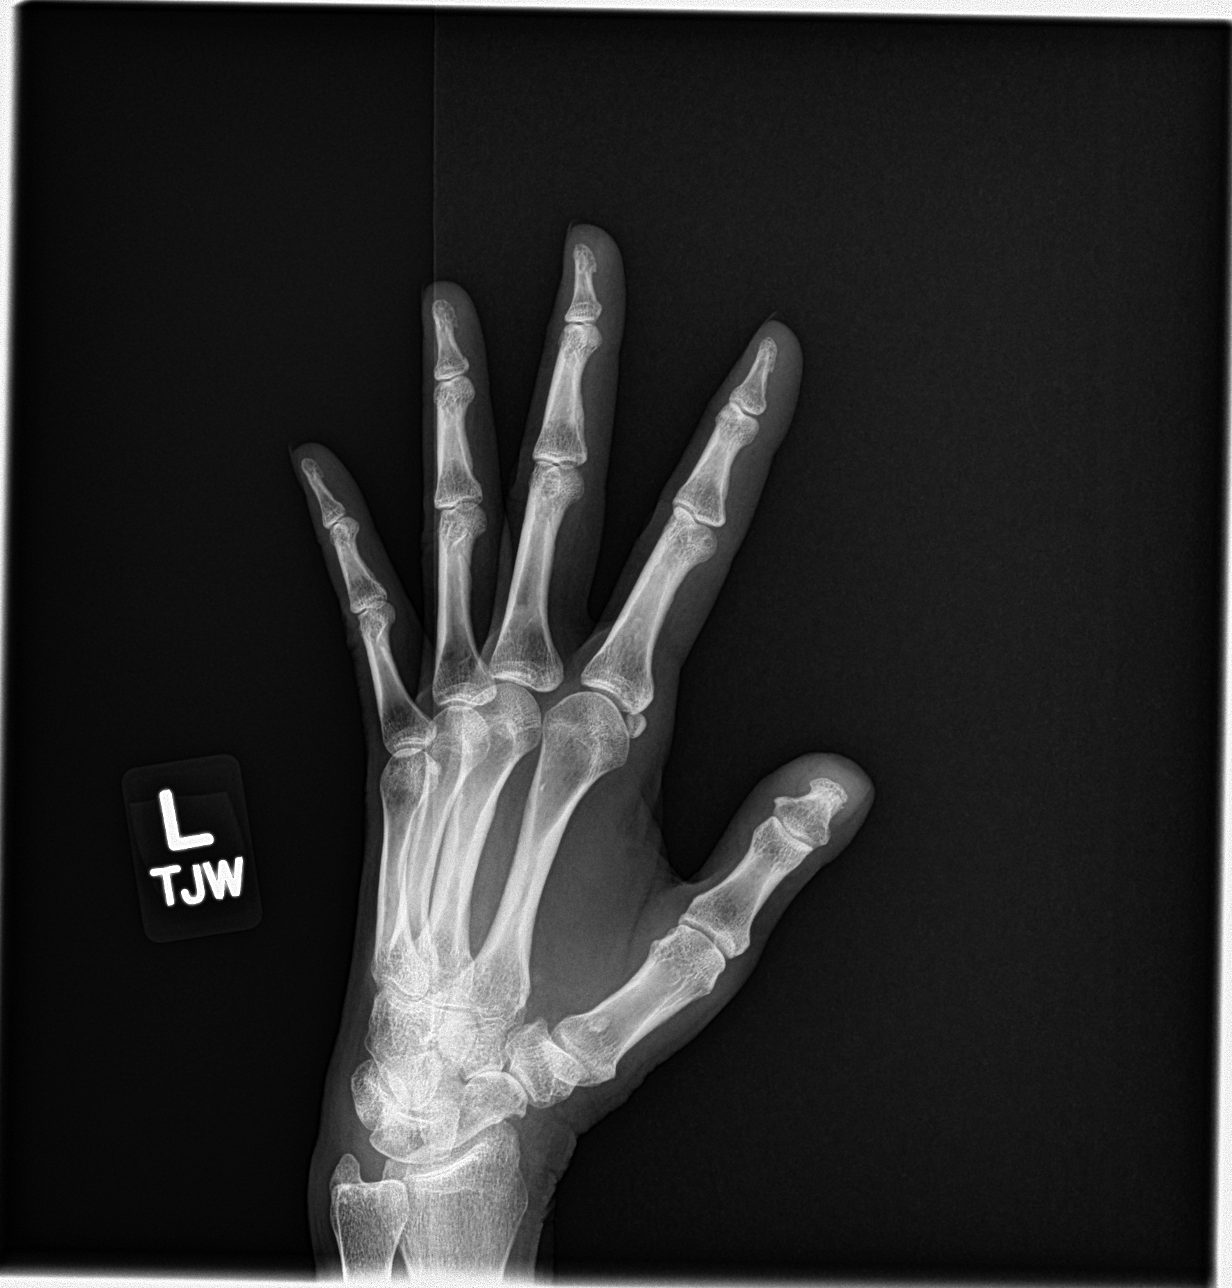

[hand lat]
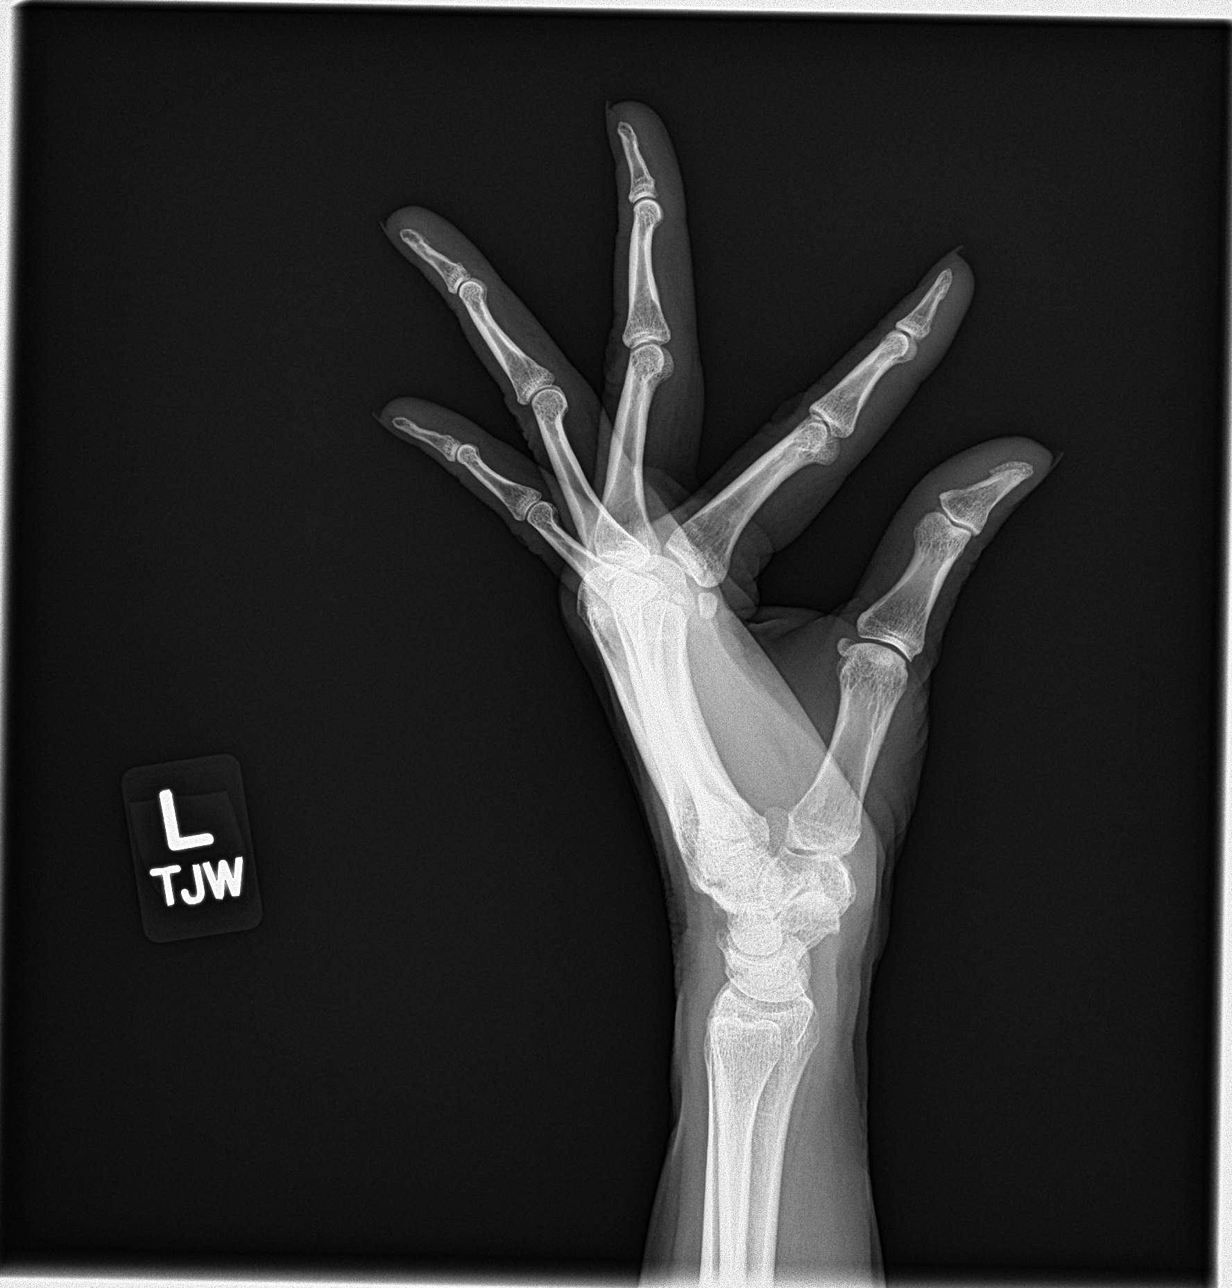

[3 of 3 positions shown; findings below may reference images not displayed]

FINDINGS: There is no evidence of fracture or dislocation. There is no
evidence of arthropathy or other focal bone abnormality. Soft
tissues are unremarkable.
IMPRESSION: Negative.

## 2021-01-03 IMAGING — DX DG WRIST COMPLETE 3+V*L*
4 series · 4 of 4 positions shown · non-contrast
Comparison: None.

CLINICAL DATA: Bilateral wrist pain.

EXAM:
LEFT WRIST - COMPLETE 3+ VIEW

[wrist ap]
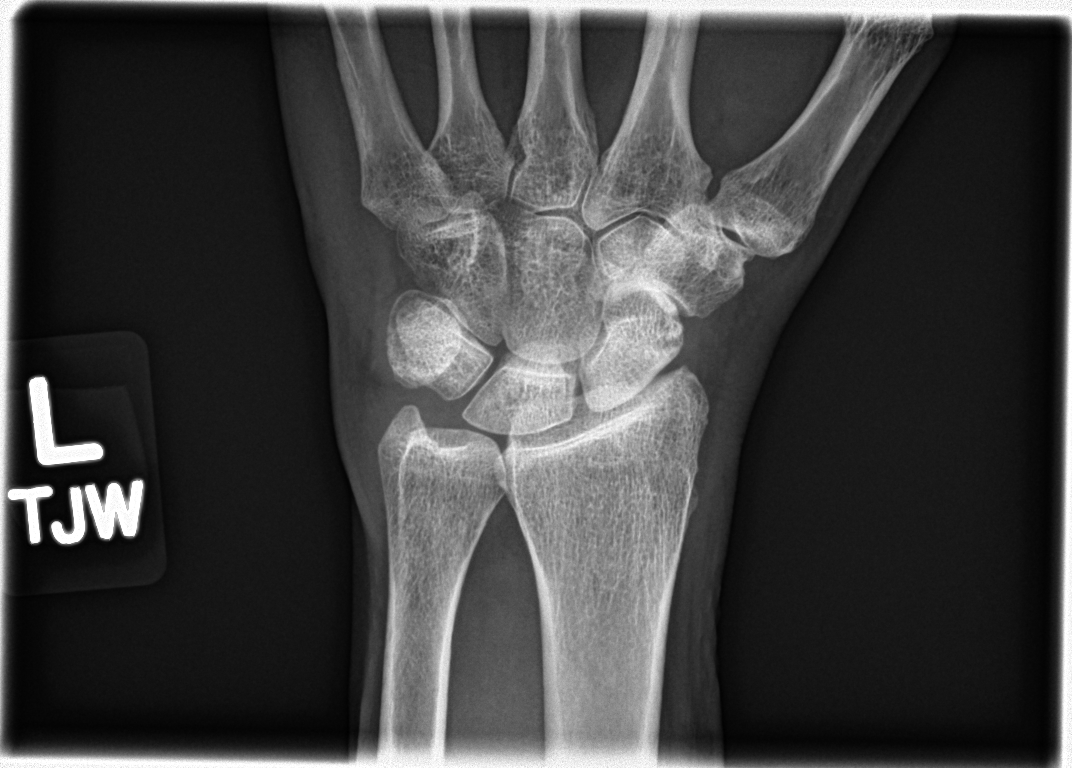

[wrist obl]
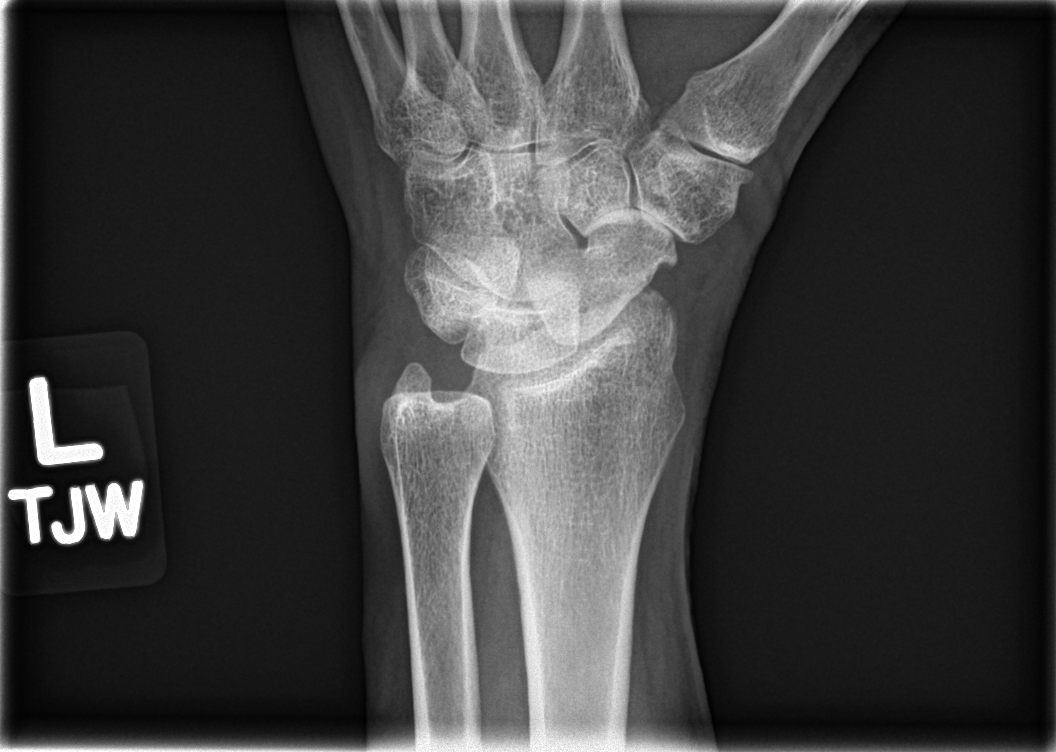

[wrist lat]
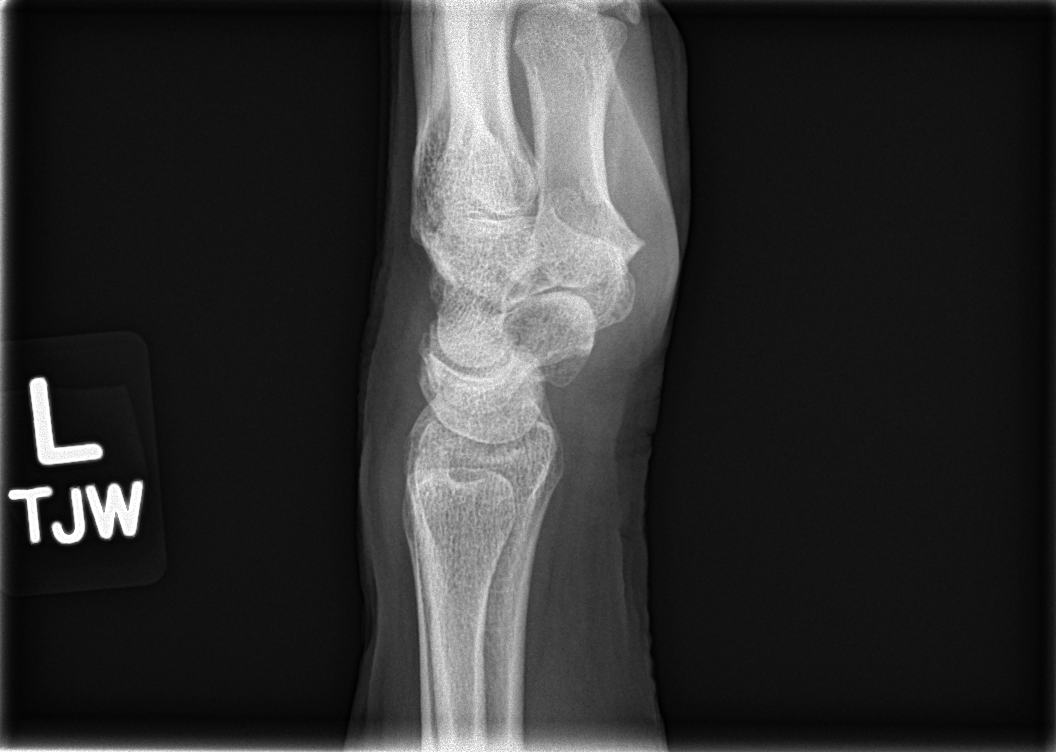

[wrist pa]
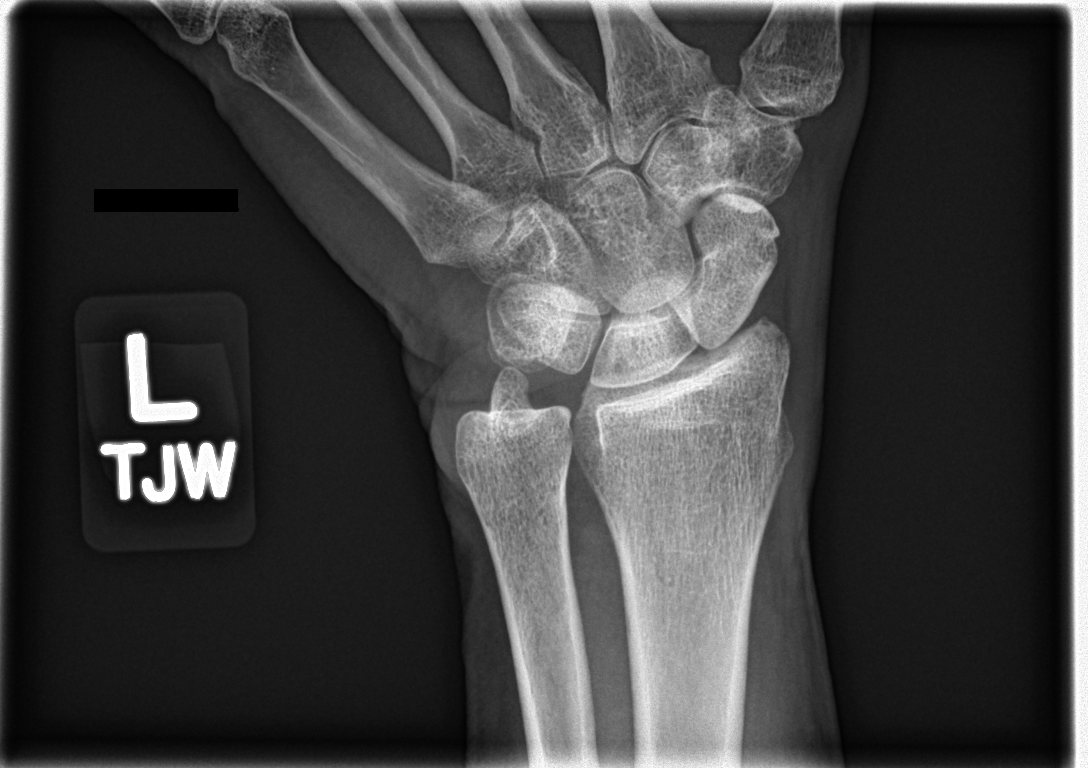

[4 of 4 positions shown; findings below may reference images not displayed]

FINDINGS: There is no evidence of fracture or dislocation. There is no
evidence of arthropathy or other focal bone abnormality. Soft
tissues are unremarkable.
IMPRESSION: Negative.

## 2021-01-07 ENCOUNTER — Ambulatory Visit: Payer: BC Managed Care – PPO | Admitting: Family

## 2021-02-19 ENCOUNTER — Other Ambulatory Visit: Payer: Self-pay

## 2021-02-19 ENCOUNTER — Other Ambulatory Visit: Payer: Self-pay | Admitting: Podiatry

## 2021-02-19 ENCOUNTER — Ambulatory Visit (INDEPENDENT_AMBULATORY_CARE_PROVIDER_SITE_OTHER): Payer: BC Managed Care – PPO

## 2021-02-19 ENCOUNTER — Encounter: Payer: Self-pay | Admitting: Podiatry

## 2021-02-19 ENCOUNTER — Ambulatory Visit (INDEPENDENT_AMBULATORY_CARE_PROVIDER_SITE_OTHER): Payer: BC Managed Care – PPO | Admitting: Podiatry

## 2021-02-19 DIAGNOSIS — M2011 Hallux valgus (acquired), right foot: Secondary | ICD-10-CM

## 2021-02-19 DIAGNOSIS — M2012 Hallux valgus (acquired), left foot: Secondary | ICD-10-CM

## 2021-02-19 DIAGNOSIS — M2010 Hallux valgus (acquired), unspecified foot: Secondary | ICD-10-CM

## 2021-02-19 NOTE — Progress Notes (Signed)
Subjective:  Patient ID: Wendy Bautista, female    DOB: 1966-01-14,  MRN: [id number]  Chief Complaint  Patient presents with  . Foot Pain    Patient presents today for painful bunions bilat hallux x 1 year.  She says the right seems bigger but the left one hurts more.  She says they burn and ache and are worse when trying to go to sleep    55 y.o. female presents with the above complaint.  Patient presents with complaint of bilateral bunion pain with left greater than right side in terms of pain.  Patient states that it has been painful with shoe rubbing against it.  Patient has tried most of the conservative treatment options none of which has helped.  She is interested in doing surgical intervention however she would like to discuss a financial consideration of it.  She states that she has tried offloading padding and none of which has helped.  She would like to discuss all the options that are available to her including orthotics as well.  She denies any other acute complaints.  She has not seen anyone else prior to seeing me for this.  Review of Systems: Negative except as noted in the HPI. Denies N/V/F/Ch.  Past Medical History:  Diagnosis Date  . Allergy   . Anemia    Betathalassemia Minor  . B12 deficiency   . GERD (gastroesophageal reflux disease)   . Lichen sclerosus   . Thalassemia trait, beta     Current Outpatient Medications:  .  Cholecalciferol (VITAMIN D-3) 125 MCG (5000 UT) TABS, Take by mouth., Disp: , Rfl:  .  cetirizine (ZYRTEC) 10 MG tablet, Take by mouth. Take 10 mg by mouth once daily as needed, Disp: , Rfl:  .  Multiple Vitamin (MULTIVITAMIN) capsule, Take by mouth. Take by mouth once daily, Disp: , Rfl:   Social History   Tobacco Use  Smoking Status Former Smoker  . Packs/day: 0.50  . Years: 5.00  . Pack years: 2.50  . Types: Cigarettes  . Quit date: 12/13/2000  . Years since quitting: 20.2  Smokeless Tobacco Never Used    Allergies  Allergen Reactions   . Clostebol   . Clobetasol Propionate Rash   Objective:  There were no vitals filed for this visit. There is no height or weight on file to calculate BMI. Constitutional Well developed. Well nourished.  Vascular Dorsalis pedis pulses palpable bilaterally. Posterior tibial pulses palpable bilaterally. Capillary refill normal to all digits.  No cyanosis or clubbing noted. Pedal hair growth normal.  Neurologic Normal speech. Oriented to person, place, and time. Epicritic sensation to light touch grossly present bilaterally.  Dermatologic Nails well groomed and normal in appearance. No open wounds. No skin lesions.  Orthopedic: Normal joint ROM without pain or crepitus bilaterally. Hallux abductovalgus deformity present bilateral left more painful than right side this is a tracking deformity not a track bound deformity.  No first MPJ intra-articular pain noted pain on palpation to the medial eminence Left 1st MPJ diminished range of motion. Left 1st TMT without gross hypermobility. Right 1st MPJ diminished range of motion  Right 1st TMT without gross hypermobility. Lesser digital contractures absent bilaterally.   Radiographs: Taken and reviewed. Hallux abductovalgus deformity present. Metatarsal parabola normal.  There is an increase in intermetatarsal angle consistent with moderate bunion deformity with the right being greater than left side.  There is an increasing hallux valgus angle.  Sesamoid position is about a 5 out of 7.  Metatarsal parabola is intact.  No first MPJ arthritis noted.  Assessment:   1. Hav (hallux abducto valgus), right   2. Hav (hallux abducto valgus), left    Plan:  Patient was evaluated and treated and all questions answered.  Hallux abductovalgus deformity, left greater than right in terms of pain -XR as above. -I discussed with the patient all the conservative treatment options that are available to her including orthotics management.  At this time  given the amount of pain that she is having she is interested in surgical intervention however she would like to would still think about the procedure as well as discussed the financial consideration with the family.  Ultimately I believe patient will benefit from a chevron osteotomy and we will focus primarily on the left side due to pain as opposed to right side even though clinically the right side is a bigger bunion than left side. -I discuss the etiology of bunion and various treatment options were extensively discussed with the patient.  We will hold off on orthotics for now and will plan on getting them after the surgical intervention. -She will come back and schedule a preoperative appointment when she is ready to undergo surgery. No follow-ups on file.

## 2021-02-26 DIAGNOSIS — Z01419 Encounter for gynecological examination (general) (routine) without abnormal findings: Secondary | ICD-10-CM | POA: Diagnosis not present

## 2021-03-03 DIAGNOSIS — Z01419 Encounter for gynecological examination (general) (routine) without abnormal findings: Secondary | ICD-10-CM | POA: Diagnosis not present

## 2021-06-26 DIAGNOSIS — Z1329 Encounter for screening for other suspected endocrine disorder: Secondary | ICD-10-CM | POA: Diagnosis not present

## 2021-06-26 DIAGNOSIS — Z13220 Encounter for screening for lipoid disorders: Secondary | ICD-10-CM | POA: Diagnosis not present

## 2021-06-26 DIAGNOSIS — Z131 Encounter for screening for diabetes mellitus: Secondary | ICD-10-CM | POA: Diagnosis not present

## 2021-06-26 DIAGNOSIS — Z1321 Encounter for screening for nutritional disorder: Secondary | ICD-10-CM | POA: Diagnosis not present

## 2021-08-10 DIAGNOSIS — Z1329 Encounter for screening for other suspected endocrine disorder: Secondary | ICD-10-CM | POA: Diagnosis not present

## 2021-08-10 DIAGNOSIS — Z13228 Encounter for screening for other metabolic disorders: Secondary | ICD-10-CM | POA: Diagnosis not present

## 2021-08-10 DIAGNOSIS — N951 Menopausal and female climacteric states: Secondary | ICD-10-CM | POA: Diagnosis not present

## 2021-10-05 DIAGNOSIS — Z1231 Encounter for screening mammogram for malignant neoplasm of breast: Secondary | ICD-10-CM | POA: Diagnosis not present

## 2021-10-05 LAB — HM MAMMOGRAPHY

## 2021-10-26 DIAGNOSIS — R928 Other abnormal and inconclusive findings on diagnostic imaging of breast: Secondary | ICD-10-CM | POA: Diagnosis not present

## 2021-10-26 DIAGNOSIS — R922 Inconclusive mammogram: Secondary | ICD-10-CM | POA: Diagnosis not present

## 2021-10-28 ENCOUNTER — Telehealth: Payer: Self-pay | Admitting: Family

## 2021-10-28 NOTE — Telephone Encounter (Signed)
I spoke with patient & you are still PCP. She said that [facility] & that they had ordered. She has biopsy scheduled for Friday. She is scheduled to f/u with you in January.

## 2021-10-28 NOTE — Telephone Encounter (Signed)
Call patient I received mammogram report from [facility] at [facility] regarding asymmetry left upper quadrant.  It appears that she already had biopsy.  I do not have any further results.  Please asked patient if she has receivedresults from this biopsy. Lastly, I have not seen her 08/2019. If she would like to remain a patient of mine, she needs to schedule a follow-up with me.  Otherwise she will need to find a PCP as it has been 2+ years.

## 2021-10-28 NOTE — Telephone Encounter (Signed)
noted 

## 2021-10-29 ENCOUNTER — Encounter: Payer: Self-pay | Admitting: Nurse Practitioner

## 2021-10-29 ENCOUNTER — Other Ambulatory Visit (INDEPENDENT_AMBULATORY_CARE_PROVIDER_SITE_OTHER): Payer: BC Managed Care – PPO

## 2021-10-29 ENCOUNTER — Telehealth (INDEPENDENT_AMBULATORY_CARE_PROVIDER_SITE_OTHER): Payer: BC Managed Care – PPO | Admitting: Nurse Practitioner

## 2021-10-29 ENCOUNTER — Other Ambulatory Visit: Payer: Self-pay

## 2021-10-29 VITALS — BP 149/91 | HR 98 | Temp 99.6°F

## 2021-10-29 DIAGNOSIS — G4489 Other headache syndrome: Secondary | ICD-10-CM

## 2021-10-29 DIAGNOSIS — H938X2 Other specified disorders of left ear: Secondary | ICD-10-CM | POA: Diagnosis not present

## 2021-10-29 DIAGNOSIS — R0981 Nasal congestion: Secondary | ICD-10-CM

## 2021-10-29 DIAGNOSIS — R051 Acute cough: Secondary | ICD-10-CM | POA: Diagnosis not present

## 2021-10-29 DIAGNOSIS — R509 Fever, unspecified: Secondary | ICD-10-CM | POA: Insufficient documentation

## 2021-10-29 DIAGNOSIS — R5081 Fever presenting with conditions classified elsewhere: Secondary | ICD-10-CM

## 2021-10-29 LAB — POC COVID19 BINAXNOW: SARS Coronavirus 2 Ag: NEGATIVE

## 2021-10-29 LAB — POCT INFLUENZA A/B
Influenza A, POC: NEGATIVE
Influenza B, POC: NEGATIVE

## 2021-10-29 MED ORDER — FLUTICASONE PROPIONATE 50 MCG/ACT NA SUSP: 2.0000 | Freq: Every day | NASAL | 0 refills | Status: DC

## 2021-10-29 NOTE — Assessment & Plan Note (Signed)
And use over-the-counter analgesics as needed pending COVID and flu test.

## 2021-10-29 NOTE — Progress Notes (Signed)
Patient ID: Wendy Bautista, female    DOB: 1966-06-20, 55 y.o.   MRN: [id number]  Virtual visit completed through [employer], a video enabled telemedicine application. Due to national recommendations of social distancing due to COVID-19, a virtual visit is felt to be most appropriate for this patient at this time. Reviewed limitations, risks, security and privacy concerns of performing a virtual visit and the availability of in person appointments. I also reviewed that there may be a patient responsible charge related to this service. The patient agreed to proceed.   Patient location: home Provider location: [place] at [facility], office Persons participating in this virtual visit: patient, provider   If any vitals were documented, they were collected by patient at home unless specified below.    BP (!) 149/91   Pulse 98   Temp 99.6 F (37.6 C) Comment: per patient  LMP  (LMP Unknown) Comment: patint stating lmp 10 years ago   CC: Fever Subjective:   HPI: Wendy Bautista is a 55 y.o. female presenting on 10/29/2021 for Fever (101.8 this morning, other symptoms started on 10/27/21-left ear stuffy, head congestion, headache, had sore throat but better this morning, some sneezing, post nasal drip, cough-dry. Did not test for Covid.)  Symptom started on 10/27/2021 No covid test Not vaccinated against covid No sick contact  Used decongestant pseudophed with some relief    Relevant past medical, surgical, family and social history reviewed and updated as indicated. Interim medical history since our last visit reviewed. Allergies and medications reviewed and updated. Outpatient Medications Prior to Visit  Medication Sig Dispense Refill   cetirizine (ZYRTEC) 10 MG tablet Take by mouth. Take 10 mg by mouth once daily as needed     Cholecalciferol (VITAMIN D-3) 125 MCG (5000 UT) TABS Take by mouth.     Multiple Vitamin (MULTIVITAMIN) capsule Take by mouth. Take by mouth once daily     No  facility-administered medications prior to visit.     Per HPI unless specifically indicated in ROS section below Review of Systems  Constitutional:  Positive for fatigue and fever. Negative for chills.  HENT:  Positive for congestion, ear pain (feels full) and sore throat (was yesterday resovled today).   Respiratory:  Positive for cough. Negative for shortness of breath.   Cardiovascular:  Negative for chest pain.  Gastrointestinal:  Negative for diarrhea, nausea and vomiting.  Musculoskeletal:  Negative for arthralgias and myalgias.  Neurological:  Positive for headaches. Negative for dizziness and light-headedness.  Objective:  BP (!) 149/91   Pulse 98   Temp 99.6 F (37.6 C) Comment: per patient  LMP  (LMP Unknown) Comment: patint stating lmp 10 years ago  Wt Readings from Last 3 Encounters:  09/03/20 131 lb 6.4 oz (59.6 kg)  08/27/19 133 lb (60.3 kg)  08/15/19 134 lb 12.8 oz (61.1 kg)       Physical exam: Gen: alert, NAD, not ill appearing Pulm: speaks in complete sentences without increased work of breathing Psych: normal mood, normal thought content      Results for orders placed or performed in visit on 10/09/21  [facility] MAMMOGRAPHY  Result Value Ref Range   HM Mammogram 0-4 Bi-Rad 0-4 Bi-Rad, Self Reported Normal   Assessment & Plan:   Problem List Items Addressed This Visit       Nervous and Auditory   Sensation of fullness in left ear    We will send in some Flonase to see if this helps the sensation of  fullness in her ear.  Pending COVID and flu tests.      Relevant Medications   fluticasone (FLONASE) 50 MCG/ACT nasal spray     Other   Other headache syndrome    Over-the-counter medications for acute headaches dealing with possible viral illness.  Stay hydrated.  Pending flu and COVID test.      Relevant Orders   Influenza A/B (Completed)   Nasal congestion - Primary    Start using Flonase as prescribed continue to monitor      Relevant Medications    fluticasone (FLONASE) 50 MCG/ACT nasal spray   Other Relevant Orders   POC COVID-19 (Completed)   Fever    And use over-the-counter analgesics as needed pending COVID and flu test.      Relevant Orders   Influenza A/B (Completed)   POC COVID-19 (Completed)   Acute cough    Use over-the-counter regimens for now.  Continue to monitor      Relevant Orders   Influenza A/B (Completed)   POC COVID-19 (Completed)     No orders of the defined types were placed in this encounter.  No orders of the defined types were placed in this encounter.  She will start taking her zyrtec and go for testing. Covid and flu  I discussed the assessment and treatment plan with the patient. The patient was provided an opportunity to ask questions and all were answered. The patient agreed with the plan and demonstrated an understanding of the instructions. The patient was advised to call back or seek an in-person evaluation if the symptoms worsen or if the condition fails to improve as anticipated.  Follow up plan: No follow-ups on file.  Wendy Nine, NP

## 2021-10-29 NOTE — Assessment & Plan Note (Signed)
Over-the-counter medications for acute headaches dealing with possible viral illness.  Stay hydrated.  Pending flu and COVID test.

## 2021-10-29 NOTE — Assessment & Plan Note (Signed)
Start using Flonase as prescribed continue to monitor

## 2021-10-29 NOTE — Assessment & Plan Note (Signed)
We will send in some Flonase to see if this helps the sensation of fullness in her ear.  Pending COVID and flu tests.

## 2021-10-29 NOTE — Assessment & Plan Note (Signed)
Use over-the-counter regimens for now.  Continue to monitor

## 2021-11-12 DIAGNOSIS — Z79899 Other long term (current) drug therapy: Secondary | ICD-10-CM | POA: Diagnosis not present

## 2021-11-12 DIAGNOSIS — N6322 Unspecified lump in the left breast, upper inner quadrant: Secondary | ICD-10-CM | POA: Diagnosis not present

## 2021-11-12 DIAGNOSIS — R920 Mammographic microcalcification found on diagnostic imaging of breast: Secondary | ICD-10-CM | POA: Diagnosis not present

## 2021-11-12 DIAGNOSIS — N6489 Other specified disorders of breast: Secondary | ICD-10-CM | POA: Diagnosis not present

## 2021-11-12 DIAGNOSIS — Z888 Allergy status to other drugs, medicaments and biological substances status: Secondary | ICD-10-CM | POA: Diagnosis not present

## 2021-11-12 DIAGNOSIS — R928 Other abnormal and inconclusive findings on diagnostic imaging of breast: Secondary | ICD-10-CM | POA: Diagnosis not present

## 2021-11-12 DIAGNOSIS — N6022 Fibroadenosis of left breast: Secondary | ICD-10-CM | POA: Diagnosis not present

## 2021-11-12 DIAGNOSIS — Z6824 Body mass index (BMI) 24.0-24.9, adult: Secondary | ICD-10-CM | POA: Diagnosis not present

## 2021-11-12 DIAGNOSIS — Z87891 Personal history of nicotine dependence: Secondary | ICD-10-CM | POA: Diagnosis not present

## 2021-11-17 ENCOUNTER — Telehealth: Payer: Self-pay | Admitting: Family

## 2021-11-17 NOTE — Telephone Encounter (Signed)
Call pt  I can see she had biopsy left breast performed [facility] 12 12/2020.  I have not received any results of biopsy.  Has patient heard from [facility]?  On review chart , I cannot see anything as a relates to results in careeverywhere.  Please ask patient to call me in the next few days if she has not heard results from this.

## 2021-11-18 NOTE — Telephone Encounter (Signed)
LMTCB

## 2021-11-20 ENCOUNTER — Other Ambulatory Visit: Payer: Self-pay | Admitting: Nurse Practitioner

## 2021-11-20 DIAGNOSIS — R0981 Nasal congestion: Secondary | ICD-10-CM

## 2021-11-20 DIAGNOSIS — H938X2 Other specified disorders of left ear: Secondary | ICD-10-CM

## 2021-11-25 NOTE — Telephone Encounter (Signed)
Please circle back and ensure pt has rec'd biopsy results from breast from [facility]

## 2021-11-26 NOTE — Telephone Encounter (Signed)
These are in Care EveryWhere. 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 Surgical  consultation is advised. Exam End: 11/12/21 11:46

## 2021-11-27 NOTE — Telephone Encounter (Signed)
Call pt Left breast biopsy result warrant surgery consult  Does she have this arranged with [facility]? Does she need a referral from our office?  If so please place to Dr Guy Begin for abnormal left breast biopsy with  sclerosing adenosis and microcalcifications

## 2021-11-27 NOTE — Telephone Encounter (Signed)
LMTCB

## 2021-12-23 DIAGNOSIS — N6489 Other specified disorders of breast: Secondary | ICD-10-CM | POA: Diagnosis not present

## 2021-12-24 DIAGNOSIS — Z87891 Personal history of nicotine dependence: Secondary | ICD-10-CM | POA: Diagnosis not present

## 2021-12-24 DIAGNOSIS — Z79899 Other long term (current) drug therapy: Secondary | ICD-10-CM | POA: Diagnosis not present

## 2021-12-24 DIAGNOSIS — L905 Scar conditions and fibrosis of skin: Secondary | ICD-10-CM | POA: Diagnosis not present

## 2021-12-24 DIAGNOSIS — K219 Gastro-esophageal reflux disease without esophagitis: Secondary | ICD-10-CM | POA: Diagnosis not present

## 2021-12-24 DIAGNOSIS — N6489 Other specified disorders of breast: Secondary | ICD-10-CM | POA: Diagnosis not present

## 2021-12-24 DIAGNOSIS — E785 Hyperlipidemia, unspecified: Secondary | ICD-10-CM | POA: Diagnosis not present

## 2021-12-24 DIAGNOSIS — N6002 Solitary cyst of left breast: Secondary | ICD-10-CM | POA: Diagnosis not present

## 2021-12-24 DIAGNOSIS — N6022 Fibroadenosis of left breast: Secondary | ICD-10-CM | POA: Diagnosis not present

## 2022-01-01 DIAGNOSIS — N6489 Other specified disorders of breast: Secondary | ICD-10-CM | POA: Diagnosis not present

## 2022-01-05 NOTE — Telephone Encounter (Signed)
Please call pt again see below message

## 2022-01-06 NOTE — Telephone Encounter (Signed)
See Care Everywhere

## 2022-01-08 ENCOUNTER — Ambulatory Visit: Payer: BC Managed Care – PPO | Admitting: Family

## 2022-03-03 DIAGNOSIS — Z01419 Encounter for gynecological examination (general) (routine) without abnormal findings: Secondary | ICD-10-CM | POA: Diagnosis not present

## 2022-03-18 DIAGNOSIS — N951 Menopausal and female climacteric states: Secondary | ICD-10-CM | POA: Diagnosis not present

## 2022-03-18 DIAGNOSIS — N952 Postmenopausal atrophic vaginitis: Secondary | ICD-10-CM | POA: Diagnosis not present

## 2022-03-18 DIAGNOSIS — R232 Flushing: Secondary | ICD-10-CM | POA: Diagnosis not present

## 2022-07-08 ENCOUNTER — Telehealth: Payer: Self-pay | Admitting: *Deleted

## 2022-07-08 NOTE — Chronic Care Management (AMB) (Signed)
  Care Coordination  Note  07/08/2022 Name: Kamrynn Melott MRN: [id number] DOB: Jun 09, 1966  Shamecca Whitebread is a 56 y.o. year old female who is a primary care patient of Allegra Grana, FNP. I reached out to Michaelene Song by phone today to offer care coordination services.      Ms. Budzik was given information about Care Coordination services today including:  The Care Coordination services include support from the care team which includes your Nurse Coordinator, Clinical Social Worker, or Pharmacist.  The Care Coordination team is here to help remove barriers to the health concerns and goals most important to you. Care Coordination services are voluntary and the patient may decline or stop services at any time by request to their care team member.   Patient agreed to services and verbal consent obtained.   Follow up plan: Telephone appointment with care coordination team member scheduled for: 07/09/2022  Burman Nieves, [facility] Care Coordination Care Guide Direct Dial: [phone]

## 2022-07-09 ENCOUNTER — Ambulatory Visit: Payer: Self-pay

## 2022-07-09 NOTE — Telephone Encounter (Signed)
This encounter was created in error - please disregard.

## 2022-07-09 NOTE — Patient Instructions (Signed)
Visit Information  Thank you for taking time to visit with me today. Please don't hesitate to contact me if I can be of assistance to you.   Following are the goals we discussed today:   Goals Addressed             This Visit's Progress    Patient Stated: " I would like to determine if my primary provider can do my gynecological workup"       Care Coordination Interventions: Advised patient to to contact her primary care provider office to inquire if gyn services can be done. Discussed with patient importance of having yearly physicals and advised patient to contact primary provider office to schedule Reviewed medications with patient and discussed importance of compliance Reviewed scheduled/upcoming provider appointments            If you are experiencing a Mental Health or Behavioral Health Crisis or need someone to talk to, please call the Suicide and Crisis Lifeline: 988 call 1-800-273-TALK (toll free, 24 hour hotline)  Patient verbalizes understanding of instructions and care plan provided today and agrees to view in MyChart. Active MyChart status and patient understanding of how to access instructions and care plan via MyChart confirmed with patient.     No further follow up required:    George Ina RN,BSN,CCM RN Care Manager Coordinator [facility]  [phone]

## 2022-07-09 NOTE — Patient Outreach (Signed)
  Care Coordination   Initial Visit Note   07/09/2022 Name: Wendy Bautista MRN: [id number] DOB: 1966/08/22  Wendy Bautista is a 56 y.o. year old female who sees Arnett, Lyn Records, FNP for primary care. I spoke with  Michaelene Song by phone today  What matters to the patients health and wellness today?  Patient inquired if her primary care provider could manage her gynecology exams.    Goals Addressed             This Visit's Progress    Patient Stated: " I would like to determine if my primary provider can do my gynecological workup"       Care Coordination Interventions: Advised patient to to contact her primary care provider office to inquire if gyn services can be done. Discussed with patient importance of having yearly physicals and advised patient to contact primary provider office to schedule Reviewed medications with patient and discussed importance of compliance Reviewed scheduled/upcoming provider appointments           SDOH assessments and interventions completed:   Yes SDOH Interventions Today    Flowsheet Row Most Recent Value  SDOH Interventions   Food Insecurity Interventions Intervention Not Indicated  Housing Interventions Intervention Not Indicated  Transportation Interventions Intervention Not Indicated       Care Coordination Interventions Activated:  Yes Care Coordination Interventions:  Yes, provided  Follow up plan: No further intervention required.  Encounter Outcome:  Pt. Visit Completed  George Ina RN,BSN,CCM RN Care Manager Coordinator [facility]  [phone]

## 2022-08-18 DIAGNOSIS — N951 Menopausal and female climacteric states: Secondary | ICD-10-CM | POA: Diagnosis not present

## 2022-08-18 DIAGNOSIS — R232 Flushing: Secondary | ICD-10-CM | POA: Diagnosis not present

## 2022-08-18 DIAGNOSIS — N952 Postmenopausal atrophic vaginitis: Secondary | ICD-10-CM | POA: Diagnosis not present

## 2022-10-07 DIAGNOSIS — Z1231 Encounter for screening mammogram for malignant neoplasm of breast: Secondary | ICD-10-CM | POA: Diagnosis not present

## 2022-11-08 ENCOUNTER — Ambulatory Visit (INDEPENDENT_AMBULATORY_CARE_PROVIDER_SITE_OTHER): Payer: BC Managed Care – PPO | Admitting: Nurse Practitioner

## 2022-11-08 VITALS — BP 100/72 | HR 62 | Temp 98.6°F | Resp 10 | Ht 65.0 in | Wt 137.0 lb

## 2022-11-08 DIAGNOSIS — H9202 Otalgia, left ear: Secondary | ICD-10-CM | POA: Diagnosis not present

## 2022-11-08 NOTE — Patient Instructions (Signed)
Nice to see you today I would start back on the flonase and zyrtec. If this does not help within a wek we can try prednisone. Just reach out to me via mychart or call the office.  If that does not help we will send you to an Ear, nose and throat (ENT) office

## 2022-11-08 NOTE — Progress Notes (Signed)
Acute Office Visit  Subjective:     Patient ID: Wendy Bautista, female    DOB: 06-02-1966, 56 y.o.   MRN: [id number]  Chief Complaint  Patient presents with   Ear Pain    Left ear pain x 2 weeks, started with symptoms of cold-stuffy/congested nose about 3 to 4 weeks ago, that improved but then left ear started hurting, having some pain behind the left ear and in the jaw area.    HPI Patient is in today for Ear pain  Symptoms started approx 3-4 weeks ago with a head cold or allergies. States that she did not feel bad nor have a fever No covid test States that she was at church and her in laws were sick. States that she started having symptoms 2 days later. Her left ear has been bothering her for approx 2 weeks. States feels better today than it did over the weekend  States that she has used afrin, pseudoephedrine, and tylenol without great releif   Review of Systems  Constitutional:  Positive for malaise/fatigue. Negative for chills and fever.  HENT:  Positive for ear pain. Negative for ear discharge, sinus pain and sore throat.   Respiratory:  Negative for cough and shortness of breath.   Musculoskeletal:  Negative for joint pain and myalgias.  Neurological:  Positive for headaches.        Objective:    BP 100/72   Pulse 62   Temp 98.6 F (37 C) (Oral)   Resp 10   Ht 5\' 5"  (1.651 m) Comment: per patient  Wt 137 lb (62.1 kg)   LMP  (LMP Unknown) Comment: patint stating lmp 10 years ago  SpO2 98%   BMI 22.80 kg/m    Physical Exam Vitals and nursing note reviewed.  Constitutional:      Appearance: Normal appearance.  HENT:     Right Ear: Tympanic membrane, ear canal and external ear normal.     Left Ear: Ear canal and external ear normal.     Ears:     Comments: Scarring to left TM     Nose:     Right Sinus: No maxillary sinus tenderness or frontal sinus tenderness.     Left Sinus: No maxillary sinus tenderness or frontal sinus tenderness.     Mouth/Throat:      Mouth: Mucous membranes are moist.     Pharynx: Oropharynx is clear.  Eyes:     Extraocular Movements: Extraocular movements intact.     Pupils: Pupils are equal, round, and reactive to light.  Cardiovascular:     Rate and Rhythm: Normal rate and regular rhythm.     Heart sounds: Normal heart sounds.  Pulmonary:     Effort: Pulmonary effort is normal.     Breath sounds: Normal breath sounds.  Lymphadenopathy:     Cervical: No cervical adenopathy.  Neurological:     Mental Status: She is alert.     No results found for any visits on 11/08/22.      Assessment & Plan:   Problem List Items Addressed This Visit       Other   Left ear pain - Primary    Exam normal in office.  Patient does have scarring to left TM.  Will start patient on Zyrtec and Flonase.  If this does not improve she will reach out we can start her on a short course of prednisone.  If those to treat modalities fail patient will need a ENT referral.  Patient can reach out after using Zyrtec and Flonase for approximately 1 week if no improvement I will write prescription prednisone without her to be reevaluated in office.       No orders of the defined types were placed in this encounter.   Return if symptoms worsen or fail to improve.  Audria Nine, NP

## 2022-11-08 NOTE — Assessment & Plan Note (Signed)
Exam normal in office.  Patient does have scarring to left TM.  Will start patient on Zyrtec and Flonase.  If this does not improve she will reach out we can start her on a short course of prednisone.  If those to treat modalities fail patient will need a ENT referral.  Patient can reach out after using Zyrtec and Flonase for approximately 1 week if no improvement I will write prescription prednisone without her to be reevaluated in office.

## 2023-03-04 ENCOUNTER — Ambulatory Visit: Payer: No Typology Code available for payment source | Admitting: Nurse Practitioner

## 2023-10-10 ENCOUNTER — Encounter: Payer: Self-pay | Admitting: *Deleted

## 2023-10-10 ENCOUNTER — Ambulatory Visit: Payer: No Typology Code available for payment source | Admitting: Nurse Practitioner

## 2023-10-10 VITALS — BP 118/80 | HR 68 | Temp 98.1°F | Ht 65.0 in | Wt 142.8 lb

## 2023-10-10 DIAGNOSIS — L989 Disorder of the skin and subcutaneous tissue, unspecified: Secondary | ICD-10-CM | POA: Insufficient documentation

## 2023-10-10 NOTE — Progress Notes (Signed)
   Acute Office Visit  Subjective:     Patient ID: Wendy Bautista, female    DOB: 09-24-1966, 57 y.o.   MRN: [id number]  Chief Complaint  Patient presents with   Breast Problem    Pt complains of a raised hard lump on side of left breast. No pain or itching. Noticed a couple weeks ago.     HPI Patient is in today for Skin lesion with a history of Allergies, Anemia gerd and breast surgery   Noticed it a couple weeks ago. States that she touched herself and found it. It does not itch or hurt. State that it has not changed. States that she does have a mammogram screening that is tomorrow   Review of Systems  Constitutional:  Negative for chills and fever.  Respiratory:  Negative for shortness of breath.   Cardiovascular:  Negative for chest pain.  Gastrointestinal:        BM daily   Neurological:  Negative for tingling, weakness and headaches.  Psychiatric/Behavioral:  Negative for hallucinations and suicidal ideas.         Objective:    BP 118/80   Pulse 68   Temp 98.1 F (36.7 C) (Oral)   Ht 5\' 5"  (1.651 m)   Wt 142 lb 12.8 oz (64.8 kg)   LMP  (LMP Unknown) Comment: patint stating lmp 10 years ago  SpO2 94%   BMI 23.76 kg/m  BP Readings from Last 3 Encounters:  10/10/23 118/80  11/08/22 100/72  10/29/21 (!) 149/91   Wt Readings from Last 3 Encounters:  10/10/23 142 lb 12.8 oz (64.8 kg)  11/08/22 137 lb (62.1 kg)  09/03/20 131 lb 6.4 oz (59.6 kg)   SpO2 Readings from Last 3 Encounters:  10/10/23 94%  11/08/22 98%  08/15/19 96%      Physical Exam Vitals and nursing note reviewed.  Constitutional:      Appearance: Normal appearance.  Cardiovascular:     Rate and Rhythm: Normal rate and regular rhythm.     Heart sounds: Normal heart sounds.  Pulmonary:     Effort: Pulmonary effort is normal.     Breath sounds: Normal breath sounds.  Skin:    Findings: Lesion present.     Comments: No left sided supra or infra clavicular lymphadenopathy No appreciable  left axilla lymphadenopathy   Lesion is the size of a pea. Well circumscribed and symmetrical lesion. Skin color tone with slight yellowing ting to the inferior medial portion of the lesion   Neurological:     Mental Status: She is alert.     No results found for any visits on 10/10/23.      Assessment & Plan:   Problem List Items Addressed This Visit       Musculoskeletal and Integument   Skin lesion - Primary    Small macule under left axilla.  Flesh-colored with slight yellow discoloration.  Reminds me of a mole no red flag symptoms to worry about today.  Patient will keep an eye on it we will refer patient to dermatology for lesion and skin survey      Relevant Orders   Ambulatory referral to Dermatology    No orders of the defined types were placed in this encounter.   Return if symptoms worsen or fail to improve.  Audria Nine, NP

## 2023-10-10 NOTE — Assessment & Plan Note (Signed)
Small macule under left axilla.  Flesh-colored with slight yellow discoloration.  Reminds me of a mole no red flag symptoms to worry about today.  Patient will keep an eye on it we will refer patient to dermatology for lesion and skin survey

## 2023-10-10 NOTE — Patient Instructions (Signed)
Nice to see you today. I have made a referral for dermatology. If the lesion grows, changes, starts bleeding, does not heal please reach out to the office let us know.

## 2024-03-15 ENCOUNTER — Telehealth: Payer: Self-pay | Admitting: *Deleted

## 2024-03-15 ENCOUNTER — Ambulatory Visit (INDEPENDENT_AMBULATORY_CARE_PROVIDER_SITE_OTHER): Payer: No Typology Code available for payment source | Admitting: Family

## 2024-03-15 ENCOUNTER — Encounter: Payer: Self-pay | Admitting: Family

## 2024-03-15 VITALS — BP 118/78 | HR 78 | Temp 98.2°F | Ht 65.0 in | Wt 137.4 lb

## 2024-03-15 DIAGNOSIS — Z114 Encounter for screening for human immunodeficiency virus [HIV]: Secondary | ICD-10-CM

## 2024-03-15 DIAGNOSIS — Z1159 Encounter for screening for other viral diseases: Secondary | ICD-10-CM

## 2024-03-15 DIAGNOSIS — R899 Unspecified abnormal finding in specimens from other organs, systems and tissues: Secondary | ICD-10-CM

## 2024-03-15 DIAGNOSIS — E538 Deficiency of other specified B group vitamins: Secondary | ICD-10-CM | POA: Diagnosis not present

## 2024-03-15 DIAGNOSIS — I700 Atherosclerosis of aorta: Secondary | ICD-10-CM | POA: Diagnosis not present

## 2024-03-15 DIAGNOSIS — Z0000 Encounter for general adult medical examination without abnormal findings: Secondary | ICD-10-CM

## 2024-03-15 DIAGNOSIS — D563 Thalassemia minor: Secondary | ICD-10-CM

## 2024-03-15 LAB — LIPID PANEL
Cholesterol: 254 mg/dL — ABNORMAL HIGH (ref 0–200)
HDL: 69.3 mg/dL (ref >39.00)
LDL Cholesterol: 168 mg/dL — ABNORMAL HIGH (ref 0–99)
NonHDL: 184.89
Total CHOL/HDL Ratio: 4
Triglycerides: 85 mg/dL (ref 0.0–149.0)
VLDL: 17 mg/dL (ref 0.0–40.0)

## 2024-03-15 LAB — URINALYSIS, ROUTINE W REFLEX MICROSCOPIC
Bilirubin Urine: NEGATIVE
Ketones, ur: NEGATIVE
Leukocytes,Ua: NEGATIVE
Nitrite: NEGATIVE
Specific Gravity, Urine: >=1.03 — AB (ref 1.000–1.030)
Total Protein, Urine: NEGATIVE
Urine Glucose: NEGATIVE
Urobilinogen, UA: 0.2 (ref 0.0–1.0)
pH: 6 (ref 5.0–8.0)

## 2024-03-15 LAB — CBC WITH DIFFERENTIAL/PLATELET
Basophils Absolute: 0 10*3/uL (ref 0.0–0.1)
Basophils Relative: 0.7 % (ref 0.0–3.0)
Eosinophils Absolute: 0.1 10*3/uL (ref 0.0–0.7)
Eosinophils Relative: 1.7 % (ref 0.0–5.0)
HCT: 36.3 % (ref 36.0–46.0)
Hemoglobin: 11.5 g/dL — ABNORMAL LOW (ref 12.0–15.0)
Lymphocytes Relative: 37.3 % (ref 12.0–46.0)
Lymphs Abs: 2.5 10*3/uL (ref 0.7–4.0)
MCHC: 31.7 g/dL (ref 30.0–36.0)
MCV: 61.2 fl — ABNORMAL LOW (ref 78.0–100.0)
Monocytes Absolute: 0.5 10*3/uL (ref 0.1–1.0)
Monocytes Relative: 7.3 % (ref 3.0–12.0)
Neutro Abs: 3.5 10*3/uL (ref 1.4–7.7)
Neutrophils Relative %: 53 % (ref 43.0–77.0)
Platelets: 312 10*3/uL (ref 150.0–400.0)
RBC: 5.93 Mil/uL — ABNORMAL HIGH (ref 3.87–5.11)
RDW: 17.4 % — ABNORMAL HIGH (ref 11.5–15.5)
WBC: 6.7 10*3/uL (ref 4.0–10.5)

## 2024-03-15 LAB — COMPREHENSIVE METABOLIC PANEL WITH GFR
ALT: 11 U/L (ref 0–35)
AST: 16 U/L (ref 0–37)
Albumin: 4.4 g/dL (ref 3.5–5.2)
Alkaline Phosphatase: 70 U/L (ref 39–117)
BUN: 18 mg/dL (ref 6–23)
CO2: 28 meq/L (ref 19–32)
Calcium: 9.3 mg/dL (ref 8.4–10.5)
Chloride: 106 meq/L (ref 96–112)
Creatinine, Ser: 0.9 mg/dL (ref 0.40–1.20)
GFR: 71.41 mL/min (ref >60.00)
Glucose, Bld: 94 mg/dL (ref 70–99)
Potassium: 4.4 meq/L (ref 3.5–5.1)
Sodium: 140 meq/L (ref 135–145)
Total Bilirubin: 1.1 mg/dL (ref 0.2–1.2)
Total Protein: 7.2 g/dL (ref 6.0–8.3)

## 2024-03-15 LAB — B12 AND FOLATE PANEL
Folate: 25.2 ng/mL (ref >5.9)
Vitamin B-12: 423 pg/mL (ref 211–911)

## 2024-03-15 LAB — TSH: TSH: 3.4 u[IU]/mL (ref 0.35–5.50)

## 2024-03-15 LAB — HEMOGLOBIN A1C: Hgb A1c MFr Bld: 5.8 % (ref 4.6–6.5)

## 2024-03-15 LAB — VITAMIN D 25 HYDROXY (VIT D DEFICIENCY, FRACTURES): VITD: 111.03 ng/mL (ref 30.00–100.00)

## 2024-03-15 NOTE — Patient Instructions (Addendum)
 Due colonoscopy 11/2024   Please call Dr Rosealee Albee office    [facility] [address] [place] [address] [place], [place] [address] [phone]  Due Tetanus   Health Maintenance for Postmenopausal Women Menopause is a normal process in which your ability to get pregnant comes to an end. This process happens slowly over many months or years, usually between the ages of 74 and 53. Menopause is complete when you have missed your menstrual period for 12 months. It is important to talk with your health care provider about some of the most common conditions that affect women after menopause (postmenopausal women). These include heart disease, cancer, and bone loss (osteoporosis). Adopting a healthy lifestyle and getting preventive care can help to promote your health and wellness. The actions you take can also lower your chances of developing some of these common conditions. What are the signs and symptoms of menopause? During menopause, you may have the following symptoms: Hot flashes. These can be moderate or severe. Night sweats. Decrease in sex drive. Mood swings. Headaches. Tiredness (fatigue). Irritability. Memory problems. Problems falling asleep or staying asleep. Talk with your health care provider about treatment options for your symptoms. Do I need hormone replacement therapy? Hormone replacement therapy is effective in treating symptoms that are caused by menopause, such as hot flashes and night sweats. Hormone replacement carries certain risks, especially as you become older. If you are thinking about using estrogen or estrogen with progestin, discuss the benefits and risks with your health care provider. How can I reduce my risk for heart disease and stroke? The risk of heart disease, heart attack, and stroke increases as you age. One of the causes may be a change in the body's hormones during menopause. This can affect how your body uses dietary fats,  triglycerides, and cholesterol. Heart attack and stroke are medical emergencies. There are many things that you can do to help prevent heart disease and stroke. Watch your blood pressure High blood pressure causes heart disease and increases the risk of stroke. This is more likely to develop in people who have high blood pressure readings or are overweight. Have your blood pressure checked: Every 3-5 years if you are 58-53 years of age. Every year if you are 77 years old or older. Eat a healthy diet  Eat a diet that includes plenty of vegetables, fruits, low-fat dairy products, and lean protein. Do not eat a lot of foods that are high in solid fats, added sugars, or sodium. Get regular exercise Get regular exercise. This is one of the most important things you can do for your health. Most adults should: Try to exercise for at least 150 minutes each week. The exercise should increase your heart rate and make you sweat (moderate-intensity exercise). Try to do strengthening exercises at least twice each week. Do these in addition to the moderate-intensity exercise. Spend less time sitting. Even light physical activity can be beneficial. Other tips Work with your health care provider to achieve or maintain a healthy weight. Do not use any products that contain nicotine or tobacco. These products include cigarettes, chewing tobacco, and vaping devices, such as e-cigarettes. If you need help quitting, ask your health care provider. Know your numbers. Ask your health care provider to check your cholesterol and your blood sugar (glucose). Continue to have your blood tested as directed by your health care provider. Do I need screening for cancer? Depending on your health history and family history, you may need to have  cancer screenings at different stages of your life. This may include screening for: Breast cancer. Cervical cancer. Lung cancer. Colorectal cancer. What is my risk for  osteoporosis? After menopause, you may be at increased risk for osteoporosis. Osteoporosis is a condition in which bone destruction happens more quickly than new bone creation. To help prevent osteoporosis or the bone fractures that can happen because of osteoporosis, you may take the following actions: If you are 14-39 years old, get at least 1,000 mg of calcium and at least 600 international units (IU) of vitamin D per day. If you are older than age 37 but younger than age 57, get at least 1,200 mg of calcium and at least 600 international units (IU) of vitamin D per day. If you are older than age 36, get at least 1,200 mg of calcium and at least 800 international units (IU) of vitamin D per day. Smoking and drinking excessive alcohol increase the risk of osteoporosis. Eat foods that are rich in calcium and vitamin D, and do weight-bearing exercises several times each week as directed by your health care provider. How does menopause affect my mental health? Depression may occur at any age, but it is more common as you become older. Common symptoms of depression include: Feeling depressed. Changes in sleep patterns. Changes in appetite or eating patterns. Feeling an overall lack of motivation or enjoyment of activities that you previously enjoyed. Frequent crying spells. Talk with your health care provider if you think that you are experiencing any of these symptoms. General instructions See your health care provider for regular wellness exams and vaccines. This may include: Scheduling regular health, dental, and eye exams. Getting and maintaining your vaccines. These include: Influenza vaccine. Get this vaccine each year before the flu season begins. Pneumonia vaccine. Shingles vaccine. Tetanus, diphtheria, and pertussis (Tdap) booster vaccine. Your health care provider may also recommend other immunizations. Tell your health care provider if you have ever been abused or do not feel safe at  home. Summary Menopause is a normal process in which your ability to get pregnant comes to an end. This condition causes hot flashes, night sweats, decreased interest in sex, mood swings, headaches, or lack of sleep. Treatment for this condition may include hormone replacement therapy. Take actions to keep yourself healthy, including exercising regularly, eating a healthy diet, watching your weight, and checking your blood pressure and blood sugar levels. Get screened for cancer and depression. Make sure that you are up to date with all your vaccines. This information is not intended to replace advice given to you by your health care provider. Make sure you discuss any questions you have with your health care provider. Document Revised: 04/20/2021 Document Reviewed: 04/20/2021 Elsevier Patient Education  2024 [employer].

## 2024-03-15 NOTE — Assessment & Plan Note (Addendum)
 Deferred clinical breast exam and pelvic exam due to patient preference.  Encouraged self breast exam at home.  She is [facility].  Reiterated the importance of pelvic exam in the setting of lichen sclerosus, risk of vulvar cancer.  Encouraged continued exercise.  Advised Tdap is due

## 2024-03-15 NOTE — Progress Notes (Signed)
 Assessment & Plan:  Routine physical examination Assessment & Plan: Deferred clinical breast exam and pelvic exam due to patient preference.  Encouraged self breast exam at home.  She is scheduled [facility] this month.  Reiterated the importance of pelvic exam in the setting of lichen sclerosus, risk of vulvar cancer.  Encouraged continued exercise.  Advised Tdap is due  Orders: -     VITAMIN D 25 Hydroxy (Vit-D Deficiency, Fractures) -     Hemoglobin A1c -     CBC with Differential/Platelet -     Comprehensive metabolic panel with GFR -     Lipid panel -     TSH -     Hepatitis C antibody -     HIV Antibody (routine testing w rflx) -     Iron, TIBC and Ferritin Panel -     B12 and Folate Panel -     Urinalysis, Routine w reflex microscopic  Atherosclerosis of aorta (HCC) -     Lipid panel  Beta thalassemia minor -     CBC with Differential/Platelet -     Iron, TIBC and Ferritin Panel -     B12 and Folate Panel -     Urinalysis, Routine w reflex microscopic  B12 deficiency -     B12 and Folate Panel  Encounter for screening for HIV -     HIV Antibody (routine testing w rflx)  Encounter for hepatitis C screening test for low risk patient -     Hepatitis C antibody     Return precautions given.   Risks, benefits, and alternatives of the medications and treatment plan prescribed today were discussed, and patient expressed understanding.   Education regarding symptom management and diagnosis given to patient on AVS either electronically or printed.  No follow-ups on file.  Rennie Plowman, FNP  Subjective:    Patient ID: Wendy Bautista, female    DOB: 1966-10-27, 58 y.o.   MRN: [id number]  CC: [name] is a 58 y.o. female who presents today for physical exam.    HPI: Feels well today.  No new complaints.  She had a follow-up last week with dermatology, status post biopsy.  Waiting on pathology.      Colorectal Cancer  Screening: UTD , due 11/2024 Breast Cancer Screening: Mammogram UTD 10/11/23 at [facility]  Cervical Cancer Screening: Due , reports pap smear is scheduled this month. [facility]   H/o lichen sclerosis.   Bone Health screening/DEXA for 65+: No increased fracture risk. Defer screening at this time.  Lung Cancer Screening: Doesn't have 20 year pack year history and age > 78 years yo 61 years        Tetanus - due; declines today         Exercise: Gets regular exercise walking.   Alcohol use:  occasionally Smoking/tobacco use: former smoker.    Health Maintenance  Topic Date Due   HIV Screening  Never done   Hepatitis C Screening  Never done   Pap with HPV screening  02/13/2021   COVID-19 Vaccine (1 - 2024-25 season) Never done   Zoster (Shingles) Vaccine (1 of 2) 06/14/2024*   DTaP/Tdap/Td vaccine (2 - Td or Tdap) 03/15/2025*   Flu Shot  07/13/2024   Colon Cancer Screening  11/12/2024   Mammogram  10/10/2025   HPV Vaccine  Aged Out  *Topic was postponed. The date shown is not the original due  date.    ALLERGIES: Clostebol and Clobetasol propionate  Current Outpatient Medications on File Prior to Visit  Medication Sig Dispense Refill   cetirizine (ZYRTEC) 10 MG tablet Take by mouth. Take 10 mg by mouth once daily as needed     Cholecalciferol (VITAMIN D-3) 125 MCG (5000 UT) TABS Take by mouth.     estradiol (ESTRACE) 1 MG tablet Take 1 mg by mouth at bedtime.     fluticasone (FLONASE) 50 MCG/ACT nasal spray Place 2 sprays into both nostrils daily. (Patient not taking: Reported on 10/10/2023) 16 g 0   Magnesium Gluconate (MAGNESIUM 27 PO) Take by mouth.     progesterone (PROMETRIUM) 200 MG capsule Take 200 mg by mouth at bedtime.     No current facility-administered medications on file prior to visit.    Review of Systems  Constitutional:  Negative for chills, fever and unexpected weight change.  HENT:  Negative for congestion.   Respiratory:  Negative  for cough.   Cardiovascular:  Negative for chest pain, palpitations and leg swelling.  Gastrointestinal:  Negative for nausea and vomiting.  Musculoskeletal:  Negative for arthralgias and myalgias.  Skin:  Negative for rash.  Neurological:  Negative for headaches.  Hematological:  Negative for adenopathy.  Psychiatric/Behavioral:  Negative for confusion.       Objective:    BP 118/78   Pulse 78   Temp 98.2 F (36.8 C) (Oral)   Ht 5\' 5"  (1.651 m)   Wt 137 lb 6.4 oz (62.3 kg)   LMP  (LMP Unknown) Comment: patint stating lmp 10 years ago  SpO2 97%   BMI 22.86 kg/m   BP Readings from Last 3 Encounters:  03/15/24 118/78  10/10/23 118/80  11/08/22 100/72   Wt Readings from Last 3 Encounters:  03/15/24 137 lb 6.4 oz (62.3 kg)  10/10/23 142 lb 12.8 oz (64.8 kg)  11/08/22 137 lb (62.1 kg)    Physical Exam Vitals reviewed.  Constitutional:      Appearance: Normal appearance. She is well-developed.  Eyes:     Conjunctiva/sclera: Conjunctivae normal.  Neck:     Thyroid: No thyroid mass or thyromegaly.  Cardiovascular:     Rate and Rhythm: Normal rate and regular rhythm.     Pulses: Normal pulses.     Heart sounds: Normal heart sounds.  Pulmonary:     Effort: Pulmonary effort is normal.     Breath sounds: Normal breath sounds. No wheezing, rhonchi or rales.  Abdominal:     General: Bowel sounds are normal. There is no distension.     Palpations: Abdomen is soft. Abdomen is not rigid. There is no fluid wave or mass.     Tenderness: There is no abdominal tenderness. There is no guarding or rebound.  Lymphadenopathy:     Head:     Right side of head: No submental, submandibular, tonsillar, preauricular, posterior auricular or occipital adenopathy.     Left side of head: No submental, submandibular, tonsillar, preauricular, posterior auricular or occipital adenopathy.     Cervical: No cervical adenopathy.  Skin:    General: Skin is warm and dry.  Neurological:     Mental  Status: She is alert.  Psychiatric:        Speech: Speech normal.        Behavior: Behavior normal.        Thought Content: Thought content normal.

## 2024-03-15 NOTE — Telephone Encounter (Signed)
 Call patient She has a critically high vitamin D.  Please suspend vitamin D from all supplements  Please order vitamin D and recheck in 2 weeks time

## 2024-03-15 NOTE — Telephone Encounter (Addendum)
 CRITICAL VALUE STICKER  CRITICAL VALUE: Vit D-111.03  RECEIVER (on-site recipient of call): Silvestre Moment, CMA  DATE & TIME NOTIFIED: 03/15/24  MESSENGER (representative from lab): Saa  MD NOTIFIED: Arnett  TIME OF NOTIFICATION: 12:42pm  RESPONSE:

## 2024-03-15 NOTE — Telephone Encounter (Signed)
 Spoke to pt informed her of critical Vit D  went over message below  pt verbalized understanding scheduled recheck of Vit D in 2 weeks   She has a critically high vitamin D.  Please suspend vitamin D from all supplements   Please order vitamin D and recheck in 2 weeks time

## 2024-03-16 ENCOUNTER — Encounter: Payer: Self-pay | Admitting: Family

## 2024-03-16 LAB — IRON,TIBC AND FERRITIN PANEL
%SAT: 38 % (ref 16–45)
Ferritin: 179 ng/mL (ref 16–232)
Iron: 115 ug/dL (ref 45–160)
TIBC: 303 ug/dL (ref 250–450)

## 2024-03-16 LAB — HIV ANTIBODY (ROUTINE TESTING W REFLEX): HIV 1&2 Ab, 4th Generation: NONREACTIVE

## 2024-03-16 LAB — HEPATITIS C ANTIBODY: Hepatitis C Ab: NONREACTIVE

## 2024-03-16 NOTE — Addendum Note (Signed)
 Addended by: [place], Marrissa Dai on: 03/16/2024 01:45 PM   Modules accepted: Orders

## 2024-04-03 ENCOUNTER — Other Ambulatory Visit (INDEPENDENT_AMBULATORY_CARE_PROVIDER_SITE_OTHER)

## 2024-04-03 DIAGNOSIS — R899 Unspecified abnormal finding in specimens from other organs, systems and tissues: Secondary | ICD-10-CM | POA: Diagnosis not present

## 2024-04-03 LAB — VITAMIN D 25 HYDROXY (VIT D DEFICIENCY, FRACTURES): VITD: 88.39 ng/mL (ref 30.00–100.00)

## 2024-04-04 ENCOUNTER — Encounter: Payer: Self-pay | Admitting: Family

## 2024-04-05 ENCOUNTER — Encounter: Payer: Self-pay | Admitting: Family

## 2024-04-05 ENCOUNTER — Other Ambulatory Visit: Payer: Self-pay | Admitting: Family

## 2024-04-05 DIAGNOSIS — I700 Atherosclerosis of aorta: Secondary | ICD-10-CM

## 2024-06-11 ENCOUNTER — Other Ambulatory Visit (HOSPITAL_COMMUNITY)
Admission: RE | Admit: 2024-06-11 | Discharge: 2024-06-11 | Disposition: A | Discharge status: Home / Self Care | Source: Ambulatory Visit | Attending: Certified Nurse Midwife | Admitting: Certified Nurse Midwife

## 2024-06-11 ENCOUNTER — Ambulatory Visit (INDEPENDENT_AMBULATORY_CARE_PROVIDER_SITE_OTHER): Admitting: Certified Nurse Midwife

## 2024-06-11 ENCOUNTER — Encounter: Payer: Self-pay | Admitting: Certified Nurse Midwife

## 2024-06-11 VITALS — BP 106/67 | HR 66 | Ht 65.0 in | Wt 139.0 lb

## 2024-06-11 DIAGNOSIS — Z124 Encounter for screening for malignant neoplasm of cervix: Secondary | ICD-10-CM

## 2024-06-11 DIAGNOSIS — Z1272 Encounter for screening for malignant neoplasm of vagina: Secondary | ICD-10-CM | POA: Diagnosis not present

## 2024-06-11 DIAGNOSIS — Z1231 Encounter for screening mammogram for malignant neoplasm of breast: Secondary | ICD-10-CM

## 2024-06-11 DIAGNOSIS — Z01419 Encounter for gynecological examination (general) (routine) without abnormal findings: Secondary | ICD-10-CM | POA: Diagnosis not present

## 2024-06-11 NOTE — Progress Notes (Signed)
 GYNECOLOGY ANNUAL PREVENTATIVE CARE ENCOUNTER NOTE  History:     Wendy Bautista is a 58 y.o. No obstetric history on file. female here for a routine annual gynecologic exam.  Current complaints:none. She recently moved and is transferring her care. She is on HRT Estrace  1mg  and Prometrium  200 mg . She takes a natural cream that she gets on line for her [place] as needed. Clobetasol causes a rash for her.   Denies abnormal vaginal bleeding, discharge, pelvic pain, problems with intercourse or other gynecologic concerns.     Social Relationship: married Living:husband Work:full time- Insurance Exercise: walking 3-4x a week Smoke/Alcohol/drug ldz:qnfzm quit at age 50yrs/ No alcohol use/ No history of drug use.   Gynecologic History No LMP recorded (lmp unknown). Patient is postmenopausal. Contraception: post menopausal status Last Pap: 02/13/2018. Results were: normal Last mammogram: 10/11/2023. Results were: Bi rads: 2 Benign  Obstetric History OB History  No obstetric history on file.    Past Medical History:  Diagnosis Date   Allergy    Anemia    Betathalassemia Minor   B12 deficiency    GERD (gastroesophageal reflux disease)    Lichen sclerosus    Thalassemia trait, beta     Past Surgical History:  Procedure Laterality Date   BREAST SURGERY Left    WISDOM TOOTH EXTRACTION      Current Outpatient Medications on File Prior to Visit  Medication Sig Dispense Refill   cetirizine (ZYRTEC) 10 MG tablet Take by mouth. Take 10 mg by mouth once daily as needed     estradiol  (ESTRACE ) 1 MG tablet Take 1 mg by mouth at bedtime.     fluticasone  (FLONASE ) 50 MCG/ACT nasal spray Place 2 sprays into both nostrils daily. 16 g 0   Magnesium Gluconate (MAGNESIUM 27 PO) Take by mouth.     progesterone  (PROMETRIUM ) 200 MG capsule Take 200 mg by mouth at bedtime.     No current facility-administered medications on file prior to visit.    Allergies  Allergen Reactions    Clostebol    Clobetasol Propionate Rash    Social History:  reports that she quit smoking about 23 years ago. Her smoking use included cigarettes. She started smoking about 28 years ago. She has a 2.5 pack-year smoking history. She has never used smokeless tobacco. She reports that she does not currently use alcohol after a past usage of about 1.0 standard drink of alcohol per week. She reports that she does not use drugs.  Family History  Adopted: Yes  Problem Relation Age of Onset   Thalassemia Mother    Other Other        Pt doesn't know history, was adopted    The following portions of the patient's history were reviewed and updated as appropriate: allergies, current medications, past family history, past medical history, past social history, past surgical history and problem list.  Review of Systems Pertinent items noted in HPI and remainder of comprehensive ROS otherwise negative.  Physical Exam:  BP 106/67   Pulse 66   Ht 5' 5 (1.651 m)   Wt 139 lb (63 kg)   LMP  (LMP Unknown) Comment: patint stating lmp 10 years ago  BMI 23.13 kg/m  CONSTITUTIONAL: Well-developed, well-nourished female in no acute distress.  HENT:  Normocephalic, atraumatic, External right and left ear normal. Oropharynx is clear and moist EYES: Conjunctivae and EOM are normal. Pupils are equal, round, and reactive to light. No scleral icterus.  NECK: Normal range of motion, supple, no masses.  Normal thyroid .  SKIN: Skin is warm and dry. No rash noted. Not diaphoretic. No erythema. No pallor. MUSCULOSKELETAL: Normal range of motion. No tenderness.  No cyanosis, clubbing, or edema.  2+ distal pulses. NEUROLOGIC: Alert and oriented to person, place, and time. Normal reflexes, muscle tone coordination.  PSYCHIATRIC: Normal mood and affect. Normal behavior. Normal judgment and thought content. CARDIOVASCULAR: Normal heart rate noted, regular rhythm RESPIRATORY: Clear to auscultation bilaterally. Effort and  breath sounds normal, no problems with respiration noted. BREASTS: Symmetric in size. No masses, tenderness, skin changes, nipple drainage, or lymphadenopathy bilaterally.  ABDOMEN: Soft, no distention noted.  No tenderness, rebound or guarding.  PELVIC: Normal appearing external genitalia and urethral meatus; normal appearing vaginal mucosa and cervix.  No abnormal discharge noted.  Pap smear obtained.  Normal uterine size, no other palpable masses, no uterine or adnexal tenderness.  .   Assessment and Plan:    Annual [facility] GYN exam   Pap: Will follow up results of pap smear and manage accordingly. Mammogram : ordered  Labs:none  Refills: none at this time. She will call for refills when due.  Referral: none  Routine preventative health maintenance measures emphasized. Please refer to After Visit Summary for other counseling recommendations.      Zelda Hummer, CNM [facility]  [facility],  [facility]

## 2024-06-14 LAB — CYTOLOGY - PAP
Comment: NEGATIVE
Diagnosis: NEGATIVE
High risk HPV: NEGATIVE

## 2024-07-09 ENCOUNTER — Ambulatory Visit
Admission: RE | Admit: 2024-07-09 | Discharge: 2024-07-09 | Disposition: A | Discharge status: Home / Self Care | Payer: Self-pay | Source: Ambulatory Visit | Attending: Family | Admitting: Family

## 2024-07-09 DIAGNOSIS — I700 Atherosclerosis of aorta: Secondary | ICD-10-CM | POA: Insufficient documentation

## 2024-07-10 ENCOUNTER — Ambulatory Visit: Payer: Self-pay | Admitting: Family

## 2024-07-10 DIAGNOSIS — I700 Atherosclerosis of aorta: Secondary | ICD-10-CM

## 2024-07-11 ENCOUNTER — Encounter: Payer: Self-pay | Admitting: Family

## 2024-07-11 ENCOUNTER — Other Ambulatory Visit: Payer: Self-pay | Admitting: Family

## 2024-07-11 DIAGNOSIS — K769 Liver disease, unspecified: Secondary | ICD-10-CM

## 2024-07-12 NOTE — Telephone Encounter (Signed)
 Noted

## 2024-07-19 ENCOUNTER — Ambulatory Visit
Admission: RE | Admit: 2024-07-19 | Discharge: 2024-07-19 | Disposition: A | Discharge status: Home / Self Care | Source: Ambulatory Visit | Attending: Family | Admitting: Family

## 2024-07-19 DIAGNOSIS — K769 Liver disease, unspecified: Secondary | ICD-10-CM | POA: Diagnosis present

## 2024-07-25 ENCOUNTER — Encounter: Payer: Self-pay | Admitting: Certified Nurse Midwife

## 2024-07-25 ENCOUNTER — Encounter: Payer: Self-pay | Admitting: Family

## 2024-07-25 ENCOUNTER — Other Ambulatory Visit: Payer: Self-pay | Admitting: Certified Nurse Midwife

## 2024-07-25 MED ORDER — ESTRADIOL 1 MG PO TABS: 1.0000 mg | ORAL_TABLET | Freq: Every day | ORAL | 3 refills | Status: AC

## 2024-07-25 MED ORDER — PROGESTERONE 200 MG PO CAPS: 200.0000 mg | ORAL_CAPSULE | Freq: Every day | ORAL | 3 refills | Status: AC

## 2024-08-06 ENCOUNTER — Encounter: Payer: Self-pay | Admitting: Family

## 2024-08-06 NOTE — Telephone Encounter (Signed)
 Pt requesting. Previously filled by [facility]

## 2024-08-07 ENCOUNTER — Ambulatory Visit: Payer: Self-pay | Admitting: Family

## 2024-08-07 ENCOUNTER — Other Ambulatory Visit: Payer: Self-pay | Admitting: Family

## 2024-08-07 DIAGNOSIS — R0981 Nasal congestion: Secondary | ICD-10-CM

## 2024-08-07 DIAGNOSIS — H938X2 Other specified disorders of left ear: Secondary | ICD-10-CM

## 2024-08-07 MED ORDER — FLUTICASONE PROPIONATE 50 MCG/ACT NA SUSP
2.0000 | Freq: Every day | NASAL | 3 refills | Status: DC
Start: 2024-08-07 — End: 2024-11-05

## 2024-08-21 ENCOUNTER — Other Ambulatory Visit: Payer: Self-pay | Admitting: Family

## 2024-08-21 DIAGNOSIS — K769 Liver disease, unspecified: Secondary | ICD-10-CM

## 2024-10-12 LAB — HM MAMMOGRAPHY

## 2024-10-15 ENCOUNTER — Encounter: Payer: Self-pay | Admitting: Family

## 2024-10-15 NOTE — Telephone Encounter (Signed)
 Noted

## 2024-11-03 ENCOUNTER — Other Ambulatory Visit: Payer: Self-pay | Admitting: Family

## 2024-11-03 DIAGNOSIS — R0981 Nasal congestion: Secondary | ICD-10-CM

## 2024-11-03 DIAGNOSIS — H938X2 Other specified disorders of left ear: Secondary | ICD-10-CM

## 2024-11-19 ENCOUNTER — Encounter: Payer: Self-pay | Admitting: Certified Nurse Midwife

## 2024-11-27 ENCOUNTER — Ambulatory Visit: Admitting: Nurse Practitioner

## 2024-12-04 ENCOUNTER — Ambulatory Visit
